# Patient Record
Sex: Female | Born: 1965 | Race: Black or African American | Hispanic: No | Marital: Single | Smoking: Never smoker
Health system: Southern US, Community
[De-identification: ages and names within clinical notes are randomized; demographics above are authoritative.]

## PROBLEM LIST (undated history)

## (undated) DIAGNOSIS — M1990 Unspecified osteoarthritis, unspecified site: Secondary | ICD-10-CM

## (undated) DIAGNOSIS — I341 Nonrheumatic mitral (valve) prolapse: Secondary | ICD-10-CM

## (undated) DIAGNOSIS — M797 Fibromyalgia: Secondary | ICD-10-CM

## (undated) DIAGNOSIS — M3500 Sicca syndrome, unspecified: Secondary | ICD-10-CM

## (undated) HISTORY — DX: Sjogren syndrome, unspecified: M35.00

---

## 2013-11-26 DIAGNOSIS — M069 Rheumatoid arthritis, unspecified: Secondary | ICD-10-CM | POA: Insufficient documentation

## 2013-11-26 DIAGNOSIS — I341 Nonrheumatic mitral (valve) prolapse: Secondary | ICD-10-CM | POA: Insufficient documentation

## 2013-11-26 DIAGNOSIS — K219 Gastro-esophageal reflux disease without esophagitis: Secondary | ICD-10-CM | POA: Insufficient documentation

## 2013-11-26 DIAGNOSIS — M3501 Sicca syndrome with keratoconjunctivitis: Secondary | ICD-10-CM | POA: Insufficient documentation

## 2013-12-26 DIAGNOSIS — R413 Other amnesia: Secondary | ICD-10-CM | POA: Insufficient documentation

## 2013-12-26 DIAGNOSIS — R002 Palpitations: Secondary | ICD-10-CM | POA: Insufficient documentation

## 2013-12-26 DIAGNOSIS — Z0000 Encounter for general adult medical examination without abnormal findings: Secondary | ICD-10-CM | POA: Insufficient documentation

## 2014-07-26 DIAGNOSIS — F419 Anxiety disorder, unspecified: Secondary | ICD-10-CM | POA: Insufficient documentation

## 2015-02-14 DIAGNOSIS — Z79899 Other long term (current) drug therapy: Secondary | ICD-10-CM | POA: Insufficient documentation

## 2015-10-07 DIAGNOSIS — M7062 Trochanteric bursitis, left hip: Secondary | ICD-10-CM | POA: Insufficient documentation

## 2016-04-17 DIAGNOSIS — A6000 Herpesviral infection of urogenital system, unspecified: Secondary | ICD-10-CM | POA: Insufficient documentation

## 2016-04-17 DIAGNOSIS — Z1151 Encounter for screening for human papillomavirus (HPV): Secondary | ICD-10-CM | POA: Insufficient documentation

## 2016-04-17 DIAGNOSIS — Z124 Encounter for screening for malignant neoplasm of cervix: Secondary | ICD-10-CM | POA: Insufficient documentation

## 2016-04-17 DIAGNOSIS — B009 Herpesviral infection, unspecified: Secondary | ICD-10-CM | POA: Insufficient documentation

## 2016-04-17 DIAGNOSIS — N870 Mild cervical dysplasia: Secondary | ICD-10-CM | POA: Insufficient documentation

## 2016-05-02 DIAGNOSIS — M0609 Rheumatoid arthritis without rheumatoid factor, multiple sites: Secondary | ICD-10-CM | POA: Insufficient documentation

## 2016-11-10 ENCOUNTER — Encounter (HOSPITAL_COMMUNITY): Payer: Self-pay | Admitting: *Deleted

## 2016-11-10 ENCOUNTER — Emergency Department (HOSPITAL_COMMUNITY)
Admission: EM | Admit: 2016-11-10 | Discharge: 2016-11-10 | Disposition: A | Discharge status: Home / Self Care | Payer: Federal, State, Local not specified - PPO | Attending: Emergency Medicine | Admitting: Emergency Medicine

## 2016-11-10 ENCOUNTER — Emergency Department (HOSPITAL_COMMUNITY): Payer: Federal, State, Local not specified - PPO

## 2016-11-10 DIAGNOSIS — R42 Dizziness and giddiness: Secondary | ICD-10-CM | POA: Diagnosis not present

## 2016-11-10 HISTORY — DX: Fibromyalgia: M79.7

## 2016-11-10 HISTORY — DX: Nonrheumatic mitral (valve) prolapse: I34.1

## 2016-11-10 HISTORY — DX: Unspecified osteoarthritis, unspecified site: M19.90

## 2016-11-10 LAB — BASIC METABOLIC PANEL
Anion gap: 8 (ref 5–15)
BUN: 9 mg/dL (ref 6–20)
CHLORIDE: 107 mmol/L (ref 101–111)
CO2: 23 mmol/L (ref 22–32)
CREATININE: 0.85 mg/dL (ref 0.44–1.00)
Calcium: 9.3 mg/dL (ref 8.9–10.3)
GFR calc non Af Amer: >60 mL/min (ref >60)
Glucose, Bld: 81 mg/dL (ref 65–99)
POTASSIUM: 4.1 mmol/L (ref 3.5–5.1)
SODIUM: 138 mmol/L (ref 135–145)

## 2016-11-10 LAB — CBC WITH DIFFERENTIAL/PLATELET
Basophils Absolute: 0 10*3/uL (ref 0.0–0.1)
Basophils Relative: 0 %
EOS ABS: 0 10*3/uL (ref 0.0–0.7)
Eosinophils Relative: 0 %
HCT: 35 % — ABNORMAL LOW (ref 36.0–46.0)
HEMOGLOBIN: 10.9 g/dL — AB (ref 12.0–15.0)
LYMPHS ABS: 2.3 10*3/uL (ref 0.7–4.0)
LYMPHS PCT: 35 %
MCH: 27.5 pg (ref 26.0–34.0)
MCHC: 31.1 g/dL (ref 30.0–36.0)
MCV: 88.4 fL (ref 78.0–100.0)
Monocytes Absolute: 0.9 10*3/uL (ref 0.1–1.0)
Monocytes Relative: 13 %
NEUTROS PCT: 52 %
Neutro Abs: 3.5 10*3/uL (ref 1.7–7.7)
Platelets: 303 10*3/uL (ref 150–400)
RBC: 3.96 MIL/uL (ref 3.87–5.11)
RDW: 13.9 % (ref 11.5–15.5)
WBC: 6.7 10*3/uL (ref 4.0–10.5)

## 2016-11-10 MED ORDER — MECLIZINE HCL 25 MG PO TABS: 25.0000 mg | ORAL_TABLET | Freq: Three times a day (TID) | ORAL | 0 refills | Status: DC | PRN

## 2016-11-10 MED ORDER — SODIUM CHLORIDE 0.9 % IV BOLUS (SEPSIS)
1000.0000 mL | Freq: Once | INTRAVENOUS | Status: AC
  Administered 2016-11-10: 1000 mL via INTRAVENOUS

## 2016-11-10 MED ORDER — MECLIZINE HCL 25 MG PO TABS
25.0000 mg | ORAL_TABLET | Freq: Once | ORAL | Status: AC
  Administered 2016-11-10: 25 mg via ORAL
  Filled 2016-11-10: qty 1

## 2016-11-10 NOTE — ED Notes (Addendum)
Wheeled pt in w/c to restroom. Visitor in restroom with pt.

## 2016-11-10 NOTE — ED Notes (Signed)
Pt back from restroom, placed back on monitor.

## 2016-11-10 NOTE — Discharge Instructions (Signed)
Vertigo as prescribed.  Drink plenty of fluids and get plenty of rest.  Follow-up with your primary Dr. if not improving in the next several days, and return to the emergency department if your symptoms significantly worsen or change.

## 2016-11-10 NOTE — ED Notes (Signed)
IV removed.

## 2016-11-10 NOTE — ED Notes (Signed)
ED Provider at bedside. 

## 2016-11-10 NOTE — ED Notes (Signed)
PT reports dizziness that feels as though she is spinning. PT reports it is worse with position changes. PT reports she is having to hold on to objects to walk.

## 2016-11-10 NOTE — ED Provider Notes (Signed)
[facility]-EMERGENCY DEPT Provider Note   CSN: [id number] Arrival date & time: 11/10/16  1324     History   Chief Complaint Chief Complaint  Patient presents with  . Dizziness    HPI Stephanie Kirk is a 51 y.o. female.  Patient is a 51 year old female with history of mitral valve prolapse, fibromyalgia, rheumatoid arthritis. She presents today for evaluation of headache and dizziness that started yesterday afternoon. She reports the headache is in the back of her head and that her dizziness feels like a spinning sensation. It is worse with changing position and turning her head. She denies any injury or trauma. She does report recent URI type symptoms, but denies any shortness of breath, fevers, or neck pain.   The history is provided by the patient.  Dizziness  Quality:  Head spinning Severity:  Moderate Onset quality:  Sudden Duration:  1 day Timing:  Intermittent Progression:  Worsening Chronicity:  New Relieved by:  Nothing Worsened by:  Nothing Ineffective treatments:  None tried   Past Medical History:  Diagnosis Date  . Arthritis    rheumatoid  . Fibromyalgia   . Mitral valve prolapse     There are no active problems to display for this patient.   History reviewed. No pertinent surgical history.  OB History    No data available       Home Medications    Prior to Admission medications   Not on File    Family History No family history on file.  Social History Social History  Substance Use Topics  . Smoking status: Never Smoker  . Smokeless tobacco: Never Used  . Alcohol use No     Allergies   Patient has no known allergies.   Review of Systems Review of Systems  Neurological: Positive for dizziness.  All other systems reviewed and are negative.    Physical Exam Updated Vital Signs BP 138/87 (BP Location: Left Arm)   Pulse 74   Temp 98.1 F (36.7 C) (Oral)   Resp 18   Ht 5\' 6"  (1.676 m)   Wt 155 lb (70.3 kg)   LMP 11/10/2013    SpO2 100%   BMI 25.02 kg/m   Physical Exam  Constitutional: She is oriented to person, place, and time. She appears well-developed and well-nourished. No distress.  HENT:  Head: Normocephalic and atraumatic.  Mouth/Throat: Oropharynx is clear and moist. No oropharyngeal exudate.  Eyes: EOM are normal. Pupils are equal, round, and reactive to light.  There is no nystagmus.  Neck: Normal range of motion. Neck supple.  Cardiovascular: Normal rate and regular rhythm.  Exam reveals no gallop and no friction rub.   No murmur heard. Pulmonary/Chest: Effort normal and breath sounds normal. No respiratory distress. She has no wheezes.  Abdominal: Soft. Bowel sounds are normal. She exhibits no distension. There is no tenderness.  Musculoskeletal: Normal range of motion.  Lymphadenopathy:    She has no cervical adenopathy.  Neurological: She is alert and oriented to person, place, and time. No cranial nerve deficit. She exhibits normal muscle tone. Coordination normal.  Skin: Skin is warm and dry. She is not diaphoretic.  Nursing note and vitals reviewed.    ED Treatments / Results  Labs (all labs ordered are listed, but only abnormal results are displayed) Labs Reviewed  BASIC METABOLIC PANEL  CBC WITH DIFFERENTIAL/PLATELET    EKG  EKG Interpretation None       Radiology No results found.  Procedures Procedures (including critical care  time)  Medications Ordered in ED Medications  sodium chloride 0.9 % bolus 1,000 mL (not administered)  meclizine (ANTIVERT) tablet 25 mg (not administered)     Initial Impression / Assessment and Plan / ED Course  I have reviewed the triage vital signs and the nursing notes.  Pertinent labs & imaging results that were available during my care of the patient were reviewed by me and considered in my medical decision making (see chart for details).  This patient presents with complaints of dizziness and headache. Her physical examination is  unremarkable and laboratory studies and head CT are negative. I highly doubt an acute CVA or other emergent pathology. This may well be vertigo. She was given meclizine and appears to be feeling better. She will be discharged with meclizine and when necessary return.  Final Clinical Impressions(s) / ED Diagnoses   Final diagnoses:  None    New Prescriptions New Prescriptions   No medications on file     Geoffery Lyonsouglas Hamed Debella, MD 11/10/16 1055

## 2016-11-10 NOTE — ED Triage Notes (Signed)
Pt states occipital headache and dizziness since yesterday.  States "dizzy spells" in the past, but this feels different in that she is very off balance and keeps tripping.

## 2016-11-10 NOTE — ED Notes (Signed)
Patient transported to CT 

## 2016-12-30 ENCOUNTER — Emergency Department (HOSPITAL_COMMUNITY)
Admission: EM | Admit: 2016-12-30 | Discharge: 2016-12-30 | Disposition: A | Discharge status: Home / Self Care | Payer: Federal, State, Local not specified - PPO | Attending: Emergency Medicine | Admitting: Emergency Medicine

## 2016-12-30 ENCOUNTER — Encounter (HOSPITAL_COMMUNITY): Payer: Self-pay | Admitting: Emergency Medicine

## 2016-12-30 DIAGNOSIS — Z5321 Procedure and treatment not carried out due to patient leaving prior to being seen by health care provider: Secondary | ICD-10-CM | POA: Insufficient documentation

## 2016-12-30 DIAGNOSIS — M79602 Pain in left arm: Secondary | ICD-10-CM | POA: Diagnosis not present

## 2016-12-30 LAB — I-STAT TROPONIN, ED: Troponin i, poc: 0 ng/mL (ref 0.00–0.08)

## 2016-12-30 NOTE — ED Triage Notes (Signed)
Pt presents with pain in left arm that started at appx 2300 on 12/29/16 that is worse with movement. Pt denies any prior injury to arm. Pt states she was seen by PCP for pain in back yesterday and they thought it was flu related pain.

## 2016-12-30 NOTE — ED Notes (Signed)
Pt sts she will call her PCP in the morning and follow up with them, pt left

## 2017-04-20 ENCOUNTER — Encounter: Payer: Self-pay | Admitting: Neurology

## 2017-04-20 ENCOUNTER — Ambulatory Visit (INDEPENDENT_AMBULATORY_CARE_PROVIDER_SITE_OTHER): Payer: Federal, State, Local not specified - PPO | Admitting: Neurology

## 2017-04-20 ENCOUNTER — Encounter (INDEPENDENT_AMBULATORY_CARE_PROVIDER_SITE_OTHER): Payer: Self-pay

## 2017-04-20 VITALS — BP 123/79 | HR 86 | Ht 66.0 in | Wt 152.6 lb

## 2017-04-20 DIAGNOSIS — G43709 Chronic migraine without aura, not intractable, without status migrainosus: Secondary | ICD-10-CM

## 2017-04-20 DIAGNOSIS — R413 Other amnesia: Secondary | ICD-10-CM | POA: Diagnosis not present

## 2017-04-20 DIAGNOSIS — IMO0002 Reserved for concepts with insufficient information to code with codable children: Secondary | ICD-10-CM | POA: Insufficient documentation

## 2017-04-20 MED ORDER — NORTRIPTYLINE HCL 25 MG PO CAPS: 50.0000 mg | ORAL_CAPSULE | Freq: Every day | ORAL | 11 refills | Status: DC

## 2017-04-20 NOTE — Progress Notes (Signed)
PATIENT: Stephanie Kirk DOB: 1966/05/02  Chief Complaint  Patient presents with  . NP  [place] Le  . Optic Migraines    Seeing flashes of light/ tingling in arms. blurry vison. the last 2 months increased sx.      HISTORICAL  Stephanie Kirk is a 51 year old right-handed female, seen in refer by her optometrist Dr. Conley Rolls, [place] for evaluation of same seeing flashing light, migraine, her primary care is Dr. Lawerance Bach, [place]. initial evaluation was on April 20 2017.  Have reviewed and summarized referring note, she had a history of rheumatoid arthritis, is on tapering dose of prednisone, now taking prednisone 2.5 milligrams daily, methotrexate 25 mg once a week, folic acid, also carried a diagnosis of Sjogren's, mitral valve prolapse, she has been on disability due to her rheumatoid arthritis  She reported a history of migraine headache when she was young, she was seen by neurologist about 10 years ago, was given prescription medication, but she never tried it, worry about the side effects,  Around beginning of 2018, she noted recurrent episode of flashing light in her visual field, lasting for a few minutes, oftentimes is not followed by headaches, she had a similar episode in the past, but much less frequent, she is a poor historian, it is hard for her to characterize her symptoms,  She also complains of gradual onset memory trouble, complains of excessive stress, noted to have mild word finding difficulties.  Personally reviewed CT head without contrast in February 2018 that was normal.  Laboratory evaluation in 2018 showed negative troponin, mild anemia hemoglobin of 10 point 9, normal CMP, iron panel showed ferritin 94, normal TSH 1.9  MRI of the brain in 2016 from [facility] that was normal   REVIEW OF SYSTEMS: Full 14 system review of systems performed and notable only for fever, chill, weight gain, weight loss, fatigue, palpitation, blurry vision, loss of vision, eye pain, cough,  constipation, easy bruising, joints pain, swelling, memory loss, confusion, headache, dizziness, not enough sleep, change in appetite, disinteresting activities  ALLERGIES: No Known Allergies  HOME MEDICATIONS: Current Outpatient Prescriptions  Medication Sig Dispense Refill  . meclizine (ANTIVERT) 25 MG tablet Take 1 tablet (25 mg total) by mouth 3 (three) times daily as needed for dizziness. 12 tablet 0   No current facility-administered medications for this visit.     PAST MEDICAL HISTORY: Past Medical History:  Diagnosis Date  . Arthritis    rheumatoid  . Fibromyalgia   . Mitral valve prolapse   . Sjogren's disease (HCC)     PAST SURGICAL HISTORY: No past surgical history on file.  FAMILY HISTORY: No family history on file.  SOCIAL HISTORY:  Social History   Social History  . Marital status: Single    Spouse name: N/A  . Number of children: N/A  . Years of education: N/A   Occupational History  . Not on file.   Social History Main Topics  . Smoking status: Never Smoker  . Smokeless tobacco: Never Used  . Alcohol use No  . Drug use: No  . Sexual activity: Not on file   Other Topics Concern  . Not on file   Social History Narrative   Lives at home with son and 2 grand daughters.  Retired.  HS grad.  3 Children.     PHYSICAL EXAM   Vitals:   04/20/17 0949  BP: 123/79  Pulse: 86  Weight: 152 lb 9.6 oz (69.2 kg)  Height:  5\' 6"  (1.676 m)    Not recorded      Body mass index is 24.63 kg/m.  PHYSICAL EXAMNIATION:  Gen: NAD, conversant, well nourised, obese, well groomed                     Cardiovascular: Regular rate rhythm, no peripheral edema, warm, nontender. Eyes: Conjunctivae clear without exudates or hemorrhage Neck: Supple, no carotid bruits. Pulmonary: Clear to auscultation bilaterally   NEUROLOGICAL EXAM:  MENTAL STATUS: Speech:    Speech is normal; fluent and spontaneous with normal comprehension.  Cognition:      Orientation to time, place and person     Normal recent and remote memory     Normal Attention span and concentration     Normal Language, naming, repeating,spontaneous speech     Fund of knowledge   CRANIAL NERVES: CN II: Visual fields are full to confrontation. Fundoscopic exam is normal with sharp discs and no vascular changes. Pupils are round equal and briskly reactive to light. CN III, IV, VI: extraocular movement are normal. No ptosis. CN V: Facial sensation is intact to pinprick in all 3 divisions bilaterally. Corneal responses are intact.  CN VII: Face is symmetric with normal eye closure and smile. CN VIII: Hearing is normal to rubbing fingers CN IX, X: Palate elevates symmetrically. Phonation is normal. CN XI: Head turning and shoulder shrug are intact CN XII: Tongue is midline with normal movements and no atrophy.  MOTOR: There is no pronator drift of out-stretched arms. Muscle bulk and tone are normal. Muscle strength is normal.  REFLEXES: Reflexes are 2+ and symmetric at the biceps, triceps, knees, and ankles. Plantar responses are flexor.  SENSORY: Intact to light touch, pinprick, positional sensation and vibratory sensation are intact in fingers and toes.  COORDINATION: Rapid alternating movements and fine finger movements are intact. There is no dysmetria on finger-to-nose and heel-knee-shin.    GAIT/STANCE: Posture is normal. Gait is steady with normal steps, base, arm swing, and turning. Heel and toe walking are normal. Tandem gait is normal.  Romberg is absent.   DIAGNOSTIC DATA (LABS, IMAGING, TESTING) - I reviewed patient records, labs, notes, testing and imaging myself where available.   ASSESSMENT AND PLAN  Stephanie Kirk is a 51 y.o. female   Chronic migraine Mild cognitive impairment Chronic insomnia  MRI of the brain showed no significant abnormality in 2016, CT head without contrast was normal in April 2018  Laboratory evaluation to rule out  treatable etiology  Nortriptyline 25 mg, titrating to 50 mg every night     Levert FeinsteinYijun Bilan Tedesco, M.D. Ph.D.  [facility] [address], [address] [place], [place] [address] Ph: [phone] Fax: [phone]  CC: Conley RollsLe, My [place], [place] Roberts GaudyBurns, Kevin L., MD

## 2017-04-21 LAB — C-REACTIVE PROTEIN: CRP: 2.5 mg/L (ref 0.0–4.9)

## 2017-04-21 LAB — HIV ANTIBODY (ROUTINE TESTING W REFLEX): HIV Screen 4th Generation wRfx: NONREACTIVE

## 2017-04-21 LAB — VITAMIN B12: Vitamin B-12: 606 pg/mL (ref 232–1245)

## 2017-04-21 LAB — VITAMIN D 25 HYDROXY (VIT D DEFICIENCY, FRACTURES): VIT D 25 HYDROXY: 24.2 ng/mL — AB (ref 30.0–100.0)

## 2017-04-21 LAB — RPR: RPR: NONREACTIVE

## 2017-04-21 LAB — SEDIMENTATION RATE: SED RATE: 21 mm/h (ref 0–40)

## 2017-04-22 ENCOUNTER — Telehealth: Payer: Self-pay | Admitting: *Deleted

## 2017-04-22 NOTE — Telephone Encounter (Signed)
-----   Message from Levert FeinsteinYijun Yan, MD sent at 04/22/2017  7:53 AM EDT ----- Please call patient: Laboratory evaluation showed mildly decreased vitamin D 24, otherwise was normal.

## 2017-04-22 NOTE — Telephone Encounter (Signed)
LMVM for pt to return call for lab results.  

## 2017-04-22 NOTE — Progress Notes (Signed)
She should take over-the-counter vitamin D3 supplement 1000 units daily.

## 2017-04-22 NOTE — Telephone Encounter (Signed)
Spoke to pt and relayed the results of her labs, Vit D mildly decreased.  Recommended Vit D3 supplement 1000u daily.  She will take that until seen here again in f/u.  Otherwise labs normal.  Pt verbalized understanding.

## 2017-04-29 ENCOUNTER — Other Ambulatory Visit: Payer: Federal, State, Local not specified - PPO

## 2017-05-19 ENCOUNTER — Other Ambulatory Visit: Payer: Federal, State, Local not specified - PPO

## 2017-07-19 ENCOUNTER — Encounter (HOSPITAL_COMMUNITY): Payer: Self-pay | Admitting: Emergency Medicine

## 2017-07-19 ENCOUNTER — Other Ambulatory Visit: Payer: Self-pay

## 2017-07-19 ENCOUNTER — Emergency Department (HOSPITAL_COMMUNITY): Payer: Federal, State, Local not specified - PPO

## 2017-07-19 ENCOUNTER — Emergency Department (HOSPITAL_COMMUNITY)
Admission: EM | Admit: 2017-07-19 | Discharge: 2017-07-19 | Disposition: A | Discharge status: Home / Self Care | Payer: Federal, State, Local not specified - PPO | Attending: Emergency Medicine | Admitting: Emergency Medicine

## 2017-07-19 DIAGNOSIS — M5432 Sciatica, left side: Secondary | ICD-10-CM

## 2017-07-19 DIAGNOSIS — Z79899 Other long term (current) drug therapy: Secondary | ICD-10-CM | POA: Insufficient documentation

## 2017-07-19 DIAGNOSIS — M5442 Lumbago with sciatica, left side: Secondary | ICD-10-CM | POA: Insufficient documentation

## 2017-07-19 DIAGNOSIS — M545 Low back pain: Secondary | ICD-10-CM | POA: Diagnosis present

## 2017-07-19 MED ORDER — IBUPROFEN 600 MG PO TABS: 600.0000 mg | ORAL_TABLET | Freq: Three times a day (TID) | ORAL | 0 refills | Status: AC | PRN

## 2017-07-19 MED ORDER — DEXAMETHASONE SODIUM PHOSPHATE 10 MG/ML IJ SOLN
10.0000 mg | Freq: Once | INTRAMUSCULAR | Status: AC
  Administered 2017-07-19: 10 mg via INTRAMUSCULAR
  Filled 2017-07-19: qty 1

## 2017-07-19 MED ORDER — OXYCODONE-ACETAMINOPHEN 5-325 MG PO TABS
2.0000 | ORAL_TABLET | Freq: Once | ORAL | Status: AC
  Administered 2017-07-19: 2 via ORAL
  Filled 2017-07-19: qty 2

## 2017-07-19 MED ORDER — KETOROLAC TROMETHAMINE 60 MG/2ML IM SOLN
60.0000 mg | Freq: Once | INTRAMUSCULAR | Status: AC
  Administered 2017-07-19: 60 mg via INTRAMUSCULAR
  Filled 2017-07-19: qty 2

## 2017-07-19 MED ORDER — HYDROCODONE-ACETAMINOPHEN 5-325 MG PO TABS: 1.0000 | ORAL_TABLET | ORAL | 0 refills | Status: DC | PRN

## 2017-07-19 NOTE — ED Provider Notes (Signed)
[facility] EMERGENCY DEPARTMENT Provider Note   CSN: [id number] Arrival date & time: 07/19/17  1357     History   Chief Complaint Chief Complaint  Patient presents with  . Back Pain    HPI Stephanie Kirk is a 51 y.o. female.  HPI  51 year old female with past medical history of rheumatoid arthritis and Sjogren's disease here with acute on chronic back pain.  The patient states that intermittently over the last several months, she has had recurrent episodes in which her left lower back feels tight and spasm like.  The pain shoots down her left leg with associated hot and cold feeling.  This pain seems to worsen with turning and position changes.  She feels like it is an aching, throbbing, cramp-like pain.  This pain has acutely worsened over the last several hours.  She states began after she twisted to turn to her left than right.  She denies any associated numbness or weakness in her lower extremities.  No loss of bowel or bladder function.  No abdominal pain.  No fevers, chills, weight loss, or night sweats.  Past Medical History:  Diagnosis Date  . Arthritis    rheumatoid  . Fibromyalgia   . Mitral valve prolapse   . Sjogren's disease [facility])     Patient Active Problem List   Diagnosis Date Noted  . Chronic migraine 04/20/2017  . Memory loss 04/20/2017    No past surgical history on file.  OB History    No data available       Home Medications    Prior to Admission medications   Medication Sig Start Date End Date Taking? Authorizing Provider  Calcium Carbonate-Vitamin D3 (CALCIUM 600-D) 600-400 MG-UNIT TABS Take by mouth. Once daily    [provider]  carboxymethylcellulose (REFRESH PLUS) 0.5 % SOLN 1 drop daily as needed.    [provider]  cycloSPORINE (RESTASIS) 0.05 % ophthalmic emulsion Place 1 drop into both eyes 2 (two) times daily.    [provider]  folic acid (FOLVITE) 1 MG tablet Take 1 mg by mouth daily.     [provider]  HYDROcodone-acetaminophen (NORCO/VICODIN) 5-325 MG tablet Take 1-2 tablets every 4 (four) hours as needed by mouth for severe pain. 07/19/17   Shaune Pollack, MD  hydroxychloroquine (PLAQUENIL) 200 MG tablet Take 200 mg by mouth 2 (two) times daily.    [provider]  ibuprofen (ADVIL,MOTRIN) 600 MG tablet Take 1 tablet (600 mg total) every 8 (eight) hours as needed by mouth for moderate pain. 07/19/17   Shaune Pollack, MD  iron polysaccharides (NIFEREX) 150 MG capsule Take 150 mg by mouth daily.    [provider]  magnesium oxide (MAG-OX) 400 MG tablet Take 400 mg by mouth daily.    [provider]  meclizine (ANTIVERT) 25 MG tablet Take 1 tablet (25 mg total) by mouth 3 (three) times daily as needed for dizziness. 11/10/16   Geoffery Lyons, MD  methotrexate (RHEUMATREX) 2.5 MG tablet Take 2.5 mg by mouth once a week. Caution:Chemotherapy. Protect from light.    [provider]  nortriptyline (PAMELOR) 25 MG capsule Take 2 capsules (50 mg total) by mouth at bedtime. 04/20/17   Levert Feinstein, MD  omeprazole (PRILOSEC) 40 MG capsule Take 40 mg by mouth daily.    [provider]  predniSONE (DELTASONE) 10 MG tablet Take 10 mg by mouth daily with breakfast.    [provider]  sulfaSALAzine (AZULFIDINE) 500 MG  tablet Take 500 mg by mouth 2 (two) times daily.    [provider]  valACYclovir (VALTREX) 500 MG tablet Take 500 mg by mouth daily.    [provider]  verapamil (CALAN-SR) 120 MG CR tablet Take 120 mg by mouth 2 (two) times daily.    [provider]    Family History Family History  Problem Relation Age of Onset  . Asthma Mother   . Hypertension Mother   . Hypertension Father   . Stroke Father   . Heart attack Father   . Thyroid disease Sister   . Diabetes Maternal Grandmother   . Cancer Paternal Grandmother     Social History Social History   Tobacco Use  . Smoking status:  Never Smoker  . Smokeless tobacco: Never Used  Substance Use Topics  . Alcohol use: No    Comment: quit 06/2010  . Drug use: No     Allergies   Patient has no known allergies.   Review of Systems Review of Systems  Musculoskeletal: Positive for arthralgias, back pain and gait problem (2/2 pain).  All other systems reviewed and are negative.    Physical Exam Updated Vital Signs BP 124/82 (BP Location: Right Arm)   Pulse 62   Temp 98.3 F (36.8 C) (Oral)   Resp 15   Ht 5\' 6"  (1.676 m)   Wt 72.6 kg (160 lb)   LMP 11/10/2013   SpO2 96%   BMI 25.82 kg/m   Physical Exam  Constitutional: She is oriented to person, place, and time. She appears well-developed and well-nourished. No distress.  HENT:  Head: Normocephalic and atraumatic.  Eyes: Conjunctivae are normal.  Neck: Neck supple.  Cardiovascular: Normal rate, regular rhythm and normal heart sounds. Exam reveals no friction rub.  No murmur heard. Pulmonary/Chest: Effort normal and breath sounds normal. No respiratory distress. She has no wheezes. She has no rales.  Abdominal: She exhibits no distension.  Musculoskeletal: She exhibits no edema.  Neurological: She is alert and oriented to person, place, and time. She exhibits normal muscle tone.  Skin: Skin is warm. Capillary refill takes less than 2 seconds.  Psychiatric: She has a normal mood and affect.  Nursing note and vitals reviewed.   Spine Exam: Inspection/Palpation: Moderate TTP over left paraspinal musculature with + straight leg raise. No midline TTP or deformity. No redness or warmth. Strength: 5/5 throughout LE bilaterally (hip flexion/extension, adduction/abduction; knee flexion/extension; foot dorsiflexion/plantarflexion, inversion/eversion; great toe inversion) Sensation: Intact to light touch in proximal and distal LE bilaterally Reflexes: 2+ quadriceps and achilles reflexes  ED Treatments / Results  Labs (all labs ordered are listed, but only  abnormal results are displayed) Labs Reviewed - No data to display  EKG  EKG Interpretation None       Radiology Dg Lumbar Spine Complete  Result Date: 07/19/2017 CLINICAL DATA:  Pt has hx of throbbing back pain, normally she stretches and it goes away. Pt pointing to lumbar spine. Pt has been dealing with this off and on for 1 year. Pt has limited ROM, states she can walk with assistance and had to be helped in and out of car. Denies injury to lumbar spine. EXAM: LUMBAR SPINE - COMPLETE 4+ VIEW COMPARISON:  None. FINDINGS: No fracture, bone lesion or spondylolisthesis. Mild loss disc height at L3-L4 with minor endplate osteophytes. No other degenerative change. Soft tissues are unremarkable. IMPRESSION: 1. No fracture or acute finding. 2. Mild disc degenerative change at L3-L4. Electronically Signed  By: Amie Portland M.D.   On: 07/19/2017 17:32    Procedures Procedures (including critical care time)  Medications Ordered in ED Medications  ketorolac (TORADOL) injection 60 mg (60 mg Intramuscular Given 07/19/17 1631)  oxyCODONE-acetaminophen (PERCOCET/ROXICET) 5-325 MG per tablet 2 tablet (2 tablets Oral Given 07/19/17 1631)  dexamethasone (DECADRON) injection 10 mg (10 mg Intramuscular Given 07/19/17 1737)     Initial Impression / Assessment and Plan / ED Course  I have reviewed the triage vital signs and the nursing notes.  Pertinent labs & imaging results that were available during my care of the patient were reviewed by me and considered in my medical decision making (see chart for details).     51 yo F with h/o RA, chronic back pain here with acute on chronic left paraspinal lower back pain, radiating to her leg. Plain films neg. No fevers or signs to suggest infectious etiology such as osteo or epidural abscess. She has no signs of cauda equina. Exam, history is c/w sciatica. She feels markedly improved with symptom treatmetn in ED. No abdominal pain, dysuria, or sx to suggest  referred pain from GU or intra-abdominal pathology. Will d/c with supportive care, outpt follow-up.  Final Clinical Impressions(s) / ED Diagnoses   Final diagnoses:  Sciatica of left side    New Prescriptions This SmartLink is deprecated. Use AVSMEDLIST instead to display the medication list for a patient.   Shaune Pollack, MD 07/20/17 [phone]

## 2017-07-19 NOTE — ED Notes (Signed)
Patient transported to X-ray 

## 2017-07-19 NOTE — ED Triage Notes (Addendum)
Pt has hx of back pain, normally she stretches and it goes away. Pt pointing to lumbar spine. Pt has been dealing with this off and on for 1 year. Denies loss of bladder or bowel. Pt has limited ROM, states she can walk with assistance and had to be helped in and out of car.

## 2017-07-21 ENCOUNTER — Ambulatory Visit: Payer: Federal, State, Local not specified - PPO | Admitting: Neurology

## 2017-08-17 ENCOUNTER — Other Ambulatory Visit: Payer: Federal, State, Local not specified - PPO

## 2017-12-05 ENCOUNTER — Emergency Department (HOSPITAL_COMMUNITY)
Admission: EM | Admit: 2017-12-05 | Discharge: 2017-12-05 | Disposition: A | Discharge status: Home / Self Care | Payer: Federal, State, Local not specified - PPO | Attending: Emergency Medicine | Admitting: Emergency Medicine

## 2017-12-05 ENCOUNTER — Emergency Department (HOSPITAL_COMMUNITY): Payer: Federal, State, Local not specified - PPO

## 2017-12-05 ENCOUNTER — Encounter (HOSPITAL_COMMUNITY): Payer: Self-pay | Admitting: Emergency Medicine

## 2017-12-05 ENCOUNTER — Other Ambulatory Visit: Payer: Self-pay

## 2017-12-05 DIAGNOSIS — R252 Cramp and spasm: Secondary | ICD-10-CM | POA: Insufficient documentation

## 2017-12-05 DIAGNOSIS — R079 Chest pain, unspecified: Secondary | ICD-10-CM | POA: Diagnosis not present

## 2017-12-05 DIAGNOSIS — R42 Dizziness and giddiness: Secondary | ICD-10-CM | POA: Diagnosis not present

## 2017-12-05 DIAGNOSIS — Z79899 Other long term (current) drug therapy: Secondary | ICD-10-CM | POA: Diagnosis not present

## 2017-12-05 DIAGNOSIS — M5412 Radiculopathy, cervical region: Secondary | ICD-10-CM | POA: Insufficient documentation

## 2017-12-05 DIAGNOSIS — R202 Paresthesia of skin: Secondary | ICD-10-CM | POA: Diagnosis present

## 2017-12-05 LAB — CBC
HCT: 33.2 % — ABNORMAL LOW (ref 36.0–46.0)
HEMOGLOBIN: 10.3 g/dL — AB (ref 12.0–15.0)
MCH: 27.6 pg (ref 26.0–34.0)
MCHC: 31 g/dL (ref 30.0–36.0)
MCV: 89 fL (ref 78.0–100.0)
Platelets: 360 10*3/uL (ref 150–400)
RBC: 3.73 MIL/uL — ABNORMAL LOW (ref 3.87–5.11)
RDW: 16.2 % — AB (ref 11.5–15.5)
WBC: 10.1 10*3/uL (ref 4.0–10.5)

## 2017-12-05 LAB — BASIC METABOLIC PANEL
ANION GAP: 11 (ref 5–15)
BUN: 13 mg/dL (ref 6–20)
CALCIUM: 9.1 mg/dL (ref 8.9–10.3)
CO2: 25 mmol/L (ref 22–32)
CREATININE: 0.85 mg/dL (ref 0.44–1.00)
Chloride: 101 mmol/L (ref 101–111)
GFR calc Af Amer: >60 mL/min (ref >60)
GFR calc non Af Amer: >60 mL/min (ref >60)
GLUCOSE: 81 mg/dL (ref 65–99)
Potassium: 4.1 mmol/L (ref 3.5–5.1)
Sodium: 137 mmol/L (ref 135–145)

## 2017-12-05 LAB — URINALYSIS, ROUTINE W REFLEX MICROSCOPIC
BILIRUBIN URINE: NEGATIVE
Bacteria, UA: NONE SEEN
Glucose, UA: NEGATIVE mg/dL
HGB URINE DIPSTICK: NEGATIVE
KETONES UR: NEGATIVE mg/dL
Leukocytes, UA: NEGATIVE
Nitrite: NEGATIVE
PH: 5 (ref 5.0–8.0)
Protein, ur: NEGATIVE mg/dL
Specific Gravity, Urine: 1.027 (ref 1.005–1.030)

## 2017-12-05 LAB — I-STAT TROPONIN, ED: Troponin i, poc: 0 ng/mL (ref 0.00–0.08)

## 2017-12-05 LAB — I-STAT BETA HCG BLOOD, ED (MC, WL, AP ONLY): I-stat hCG, quantitative: <5 m[IU]/mL (ref <5)

## 2017-12-05 MED ORDER — PREDNISONE 10 MG (21) PO TBPK: 10.0000 mg | ORAL_TABLET | Freq: Every day | ORAL | 0 refills | Status: DC

## 2017-12-05 MED ORDER — DEXAMETHASONE SODIUM PHOSPHATE 10 MG/ML IJ SOLN
10.0000 mg | Freq: Once | INTRAMUSCULAR | Status: AC
  Administered 2017-12-05: 10 mg via INTRAVENOUS
  Filled 2017-12-05: qty 1

## 2017-12-05 MED ORDER — HYDROCODONE-ACETAMINOPHEN 5-325 MG PO TABS: 1.0000 | ORAL_TABLET | ORAL | 0 refills | Status: DC | PRN

## 2017-12-05 NOTE — Discharge Instructions (Signed)
Go back to taking usual prednisone dose after you are done with the prednisone taper.

## 2017-12-05 NOTE — ED Notes (Signed)
Pt oob to bathroom with steady gait. 

## 2017-12-05 NOTE — ED Provider Notes (Signed)
[facility] EMERGENCY DEPARTMENT Provider Note   CSN: [id number] Arrival date & time: 12/05/17  1345     History   Chief Complaint Chief Complaint  Patient presents with  . Weakness  . Tingling  . Chest Pain    HPI Stephanie Kirk is a 52 y.o. female.  Pt presents to the ED today with several complaints.  Pt said she has tingling on the left side of her face intermittently for a few months.  She also has some numbness and pain to underside of left arm.  She also has some pain to her chest intermittently since 3/18.  The pt has been having cramping in her legs, saw neurologist, had negative bilateral LE [place] neg for DVT.  The pt is scheduled for EMG in April.  The pt also c/o dizziness.     Past Medical History:  Diagnosis Date  . Arthritis    rheumatoid  . Fibromyalgia   . Mitral valve prolapse   . Sjogren's disease [facility])     Patient Active Problem List   Diagnosis Date Noted  . Chronic migraine 04/20/2017  . Memory loss 04/20/2017    History reviewed. No pertinent surgical history.   OB History   None      Home Medications    Prior to Admission medications   Medication Sig Start Date End Date Taking? Authorizing Provider  Calcium Carbonate-Vitamin D3 (CALCIUM 600-D) 600-400 MG-UNIT TABS Take by mouth. Once daily    [provider]  carboxymethylcellulose (REFRESH PLUS) 0.5 % SOLN 1 drop daily as needed.    [provider]  cycloSPORINE (RESTASIS) 0.05 % ophthalmic emulsion Place 1 drop into both eyes 2 (two) times daily.    [provider]  folic acid (FOLVITE) 1 MG tablet Take 1 mg by mouth daily.    [provider]  HYDROcodone-acetaminophen (NORCO/VICODIN) 5-325 MG tablet Take 1-2 tablets by mouth every 4 (four) hours as needed for severe pain. 12/05/17   Jacalyn Lefevre, MD  hydroxychloroquine (PLAQUENIL) 200 MG tablet Take 200 mg by mouth 2 (two) times daily.    [provider]  ibuprofen  (ADVIL,MOTRIN) 600 MG tablet Take 1 tablet (600 mg total) every 8 (eight) hours as needed by mouth for moderate pain. 07/19/17   Shaune Pollack, MD  iron polysaccharides (NIFEREX) 150 MG capsule Take 150 mg by mouth daily.    [provider]  magnesium oxide (MAG-OX) 400 MG tablet Take 400 mg by mouth daily.    [provider]  meclizine (ANTIVERT) 25 MG tablet Take 1 tablet (25 mg total) by mouth 3 (three) times daily as needed for dizziness. 11/10/16   Geoffery Lyons, MD  methotrexate (RHEUMATREX) 2.5 MG tablet Take 2.5 mg by mouth once a week. Caution:Chemotherapy. Protect from light.    [provider]  nortriptyline (PAMELOR) 25 MG capsule Take 2 capsules (50 mg total) by mouth at bedtime. 04/20/17   Levert Feinstein, MD  omeprazole (PRILOSEC) 40 MG capsule Take 40 mg by mouth daily.    [provider]  predniSONE (STERAPRED UNI-PAK 21 TAB) 10 MG (21) TBPK tablet Take 1 tablet (10 mg total) by mouth daily. Take 6 tabs by mouth daily  for 2 days, then 5 tabs for 2 days, then 4 tabs for 2 days, then 3 tabs for 2 days, 2 tabs for 2 days, then 1 tab by mouth daily for 2 days 12/05/17   Jacalyn Lefevre, MD  sulfaSALAzine (AZULFIDINE) 500 MG tablet  Take 500 mg by mouth 2 (two) times daily.    [provider]  valACYclovir (VALTREX) 500 MG tablet Take 500 mg by mouth daily.    [provider]  verapamil (CALAN-SR) 120 MG CR tablet Take 120 mg by mouth 2 (two) times daily.    [provider]    Family History Family History  Problem Relation Age of Onset  . Asthma Mother   . Hypertension Mother   . Hypertension Father   . Stroke Father   . Heart attack Father   . Thyroid disease Sister   . Diabetes Maternal Grandmother   . Cancer Paternal Grandmother     Social History Social History   Tobacco Use  . Smoking status: Never Smoker  . Smokeless tobacco: Never Used  Substance Use Topics  . Alcohol use: No    Comment: quit 06/2010  . Drug  use: No     Allergies   Patient has no known allergies.   Review of Systems Review of Systems  Cardiovascular: Positive for chest pain.  Musculoskeletal:       Left arm pain  Neurological: Positive for dizziness, numbness and headaches.  All other systems reviewed and are negative.    Physical Exam Updated Vital Signs BP 117/69 (BP Location: Right Arm)   Pulse 89   Temp 98.1 F (36.7 C) (Oral)   Resp 17   Ht 5\' 6"  (1.676 m)   Wt 77.1 kg (170 lb)   LMP 11/10/2013   SpO2 98%   BMI 27.44 kg/m   Physical Exam  Constitutional: She is oriented to person, place, and time. She appears well-developed and well-nourished.  HENT:  Head: Normocephalic and atraumatic.  Eyes: Pupils are equal, round, and reactive to light. EOM are normal.  Neck: Normal range of motion. Neck supple.  Cardiovascular: Normal rate, regular rhythm, intact distal pulses and normal pulses.  Pulmonary/Chest: Effort normal and breath sounds normal.  Abdominal: Soft. Bowel sounds are normal.  Musculoskeletal: Normal range of motion.       Right lower leg: Normal.       Left lower leg: Normal.  Neurological: She is alert and oriented to person, place, and time.  Skin: Skin is warm and dry. Capillary refill takes less than 2 seconds.  Psychiatric: She has a normal mood and affect. Her behavior is normal.  Nursing note and vitals reviewed.    ED Treatments / Results  Labs (all labs ordered are listed, but only abnormal results are displayed) Labs Reviewed  CBC - Abnormal; Notable for the following components:      Result Value   RBC 3.73 (*)    Hemoglobin 10.3 (*)    HCT 33.2 (*)    RDW 16.2 (*)    All other components within normal limits  URINALYSIS, ROUTINE W REFLEX MICROSCOPIC - Abnormal; Notable for the following components:   Squamous Epithelial / LPF 0-5 (*)    All other components within normal limits  BASIC METABOLIC PANEL  CBG MONITORING, ED  I-STAT BETA HCG BLOOD, ED ([facility], [facility], [facility]  ONLY)  I-STAT TROPONIN, ED    EKG EKG Interpretation  Date/Time:  Saturday December 05 2017 13:52:45 EDT Ventricular Rate:  92 PR Interval:  134 QRS Duration: 72 QT Interval:  348 QTC Calculation: 430 R Axis:   67 Text Interpretation:  Normal sinus rhythm Normal ECG Confirmed by Jacalyn Lefevre [phone]) on 12/05/2017 4:01:48 PM   Radiology Dg Chest 2 View  Result Date: 12/05/2017  CLINICAL DATA:  Chest pressure and pain. EXAM: CHEST - 2 VIEW COMPARISON:  None. FINDINGS: The lungs are clear without focal pneumonia, edema, pneumothorax or pleural effusion. The cardiopericardial silhouette is within normal limits for size. The visualized bony structures of the thorax are intact. Telemetry leads overlie the chest. IMPRESSION: No active cardiopulmonary disease. Electronically Signed   By: Kennith Center M.D.   On: 12/05/2017 16:48   Ct Head Wo Contrast  Result Date: 12/05/2017 CLINICAL DATA:  Headache, acute severe headache worst of life, LEFT arm numbness and tingling since Thursday worse today EXAM: CT HEAD WITHOUT CONTRAST CT CERVICAL SPINE WITHOUT CONTRAST TECHNIQUE: Multidetector CT imaging of the head and cervical spine was performed following the standard protocol without intravenous contrast. Multiplanar CT image reconstructions of the cervical spine were also generated. COMPARISON:  CT head 11/10/2016 FINDINGS: CT HEAD FINDINGS Brain: Normal ventricular morphology. No midline shift or mass effect. Normal appearance of brain parenchyma. No intracranial hemorrhage, mass lesion, evidence of acute infarction, or extra-axial fluid collection. Vascular: Normal appearance Skull: Normal appearance Sinuses/Orbits: Clear Other: N/A CT CERVICAL SPINE FINDINGS Alignment: Normal Skull base and vertebrae: Osseous mineralization normal. Visualized skull base intact. Vertebral body heights maintained. No fracture, bone destruction or subluxation. Disc space narrowing with endplate spur formation at C4-C5,  C5-C6, LEFT C6-C7. Encroachment upon cervical neural foramina bilaterally greatest at LEFT C5-C6. Soft tissues and spinal canal: Prevertebral soft tissues normal thickness. Visualized cervical soft tissues grossly unremarkable. Disc levels:  As above Upper chest: Lung apices clear. Other: N/A IMPRESSION: Normal CT head. Degenerative disc disease changes of the cervical spine with encroachment upon cervical neural foramina by uncovertebral spurs as above. No acute cervical spine abnormalities. Electronically Signed   By: Ulyses Southward M.D.   On: 12/05/2017 17:15   Ct Cervical Spine Wo Contrast  Result Date: 12/05/2017 CLINICAL DATA:  Headache, acute severe headache worst of life, LEFT arm numbness and tingling since Thursday worse today EXAM: CT HEAD WITHOUT CONTRAST CT CERVICAL SPINE WITHOUT CONTRAST TECHNIQUE: Multidetector CT imaging of the head and cervical spine was performed following the standard protocol without intravenous contrast. Multiplanar CT image reconstructions of the cervical spine were also generated. COMPARISON:  CT head 11/10/2016 FINDINGS: CT HEAD FINDINGS Brain: Normal ventricular morphology. No midline shift or mass effect. Normal appearance of brain parenchyma. No intracranial hemorrhage, mass lesion, evidence of acute infarction, or extra-axial fluid collection. Vascular: Normal appearance Skull: Normal appearance Sinuses/Orbits: Clear Other: N/A CT CERVICAL SPINE FINDINGS Alignment: Normal Skull base and vertebrae: Osseous mineralization normal. Visualized skull base intact. Vertebral body heights maintained. No fracture, bone destruction or subluxation. Disc space narrowing with endplate spur formation at C4-C5, C5-C6, LEFT C6-C7. Encroachment upon cervical neural foramina bilaterally greatest at LEFT C5-C6. Soft tissues and spinal canal: Prevertebral soft tissues normal thickness. Visualized cervical soft tissues grossly unremarkable. Disc levels:  As above Upper chest: Lung apices  clear. Other: N/A IMPRESSION: Normal CT head. Degenerative disc disease changes of the cervical spine with encroachment upon cervical neural foramina by uncovertebral spurs as above. No acute cervical spine abnormalities. Electronically Signed   By: Ulyses Southward M.D.   On: 12/05/2017 17:15    Procedures Procedures (including critical care time)  Medications Ordered in ED Medications  dexamethasone (DECADRON) injection 10 mg (10 mg Intravenous Given 12/05/17 1711)     Initial Impression / Assessment and Plan / ED Course  I have reviewed the triage vital signs and the nursing notes.  Pertinent labs & imaging  results that were available during my care of the patient were reviewed by me and considered in my medical decision making (see chart for details).    Pt is feeling better.  She will be d/c home with instructions to f/u with NS.  Return if worse.  Final Clinical Impressions(s) / ED Diagnoses   Final diagnoses:  Cervical radiculopathy at C5    ED Discharge Orders        Ordered    predniSONE (STERAPRED UNI-PAK 21 TAB) 10 MG (21) TBPK tablet  Daily     12/05/17 1756    HYDROcodone-acetaminophen (NORCO/VICODIN) 5-325 MG tablet  Every 4 hours PRN     12/05/17 Lambert [id number]       Inri Sobieski, MD 12/05/17 1759

## 2017-12-05 NOTE — ED Notes (Signed)
Pt. Stated, I just saw my neurologist this past Monday. Suppose to do some type of study on me.

## 2017-12-05 NOTE — ED Triage Notes (Signed)
Pt. Stated, Ive had chest pain off and on since Monday. Ive had tingling in my face for 3 months.

## 2017-12-05 NOTE — ED Notes (Signed)
Pt staes she understands instructions. Home stable after teach back performed.

## 2017-12-05 NOTE — ED Notes (Signed)
Pt taken to xray 

## 2018-03-25 DIAGNOSIS — H40013 Open angle with borderline findings, low risk, bilateral: Secondary | ICD-10-CM | POA: Insufficient documentation

## 2018-03-25 DIAGNOSIS — H04123 Dry eye syndrome of bilateral lacrimal glands: Secondary | ICD-10-CM | POA: Insufficient documentation

## 2018-03-25 DIAGNOSIS — Z79899 Other long term (current) drug therapy: Secondary | ICD-10-CM | POA: Insufficient documentation

## 2018-07-08 ENCOUNTER — Emergency Department (HOSPITAL_COMMUNITY)
Admission: EM | Admit: 2018-07-08 | Discharge: 2018-07-08 | Disposition: A | Discharge status: Home / Self Care | Payer: Federal, State, Local not specified - PPO | Attending: Emergency Medicine | Admitting: Emergency Medicine

## 2018-07-08 ENCOUNTER — Emergency Department (HOSPITAL_COMMUNITY): Payer: Federal, State, Local not specified - PPO

## 2018-07-08 ENCOUNTER — Encounter (HOSPITAL_COMMUNITY): Payer: Self-pay | Admitting: Emergency Medicine

## 2018-07-08 ENCOUNTER — Other Ambulatory Visit: Payer: Self-pay

## 2018-07-08 DIAGNOSIS — M25552 Pain in left hip: Secondary | ICD-10-CM | POA: Insufficient documentation

## 2018-07-08 DIAGNOSIS — M5442 Lumbago with sciatica, left side: Secondary | ICD-10-CM | POA: Diagnosis not present

## 2018-07-08 DIAGNOSIS — M545 Low back pain: Secondary | ICD-10-CM | POA: Diagnosis present

## 2018-07-08 DIAGNOSIS — Z79899 Other long term (current) drug therapy: Secondary | ICD-10-CM | POA: Insufficient documentation

## 2018-07-08 LAB — URINALYSIS, ROUTINE W REFLEX MICROSCOPIC
BILIRUBIN URINE: NEGATIVE
Glucose, UA: NEGATIVE mg/dL
HGB URINE DIPSTICK: NEGATIVE
KETONES UR: NEGATIVE mg/dL
Leukocytes, UA: NEGATIVE
Nitrite: NEGATIVE
PROTEIN: NEGATIVE mg/dL
SPECIFIC GRAVITY, URINE: 1.011 (ref 1.005–1.030)
pH: 5 (ref 5.0–8.0)

## 2018-07-08 MED ORDER — PREDNISONE 20 MG PO TABS: 60.0000 mg | ORAL_TABLET | Freq: Every day | ORAL | 0 refills | Status: AC

## 2018-07-08 MED ORDER — LIDOCAINE 5 % EX PTCH
1.0000 | MEDICATED_PATCH | CUTANEOUS | Status: DC
  Administered 2018-07-08: 1 via TRANSDERMAL
  Filled 2018-07-08: qty 1

## 2018-07-08 MED ORDER — METHOCARBAMOL 500 MG PO TABS
1000.0000 mg | ORAL_TABLET | Freq: Once | ORAL | Status: AC
  Administered 2018-07-08: 1000 mg via ORAL
  Filled 2018-07-08: qty 2

## 2018-07-08 MED ORDER — KETOROLAC TROMETHAMINE 30 MG/ML IJ SOLN
30.0000 mg | Freq: Once | INTRAMUSCULAR | Status: AC
  Administered 2018-07-08: 30 mg via INTRAMUSCULAR
  Filled 2018-07-08: qty 1

## 2018-07-08 MED ORDER — METHOCARBAMOL 500 MG PO TABS: 500.0000 mg | ORAL_TABLET | Freq: Two times a day (BID) | ORAL | 0 refills | Status: DC

## 2018-07-08 MED ORDER — PREDNISONE 20 MG PO TABS
60.0000 mg | ORAL_TABLET | Freq: Once | ORAL | Status: AC
  Administered 2018-07-08: 60 mg via ORAL
  Filled 2018-07-08: qty 3

## 2018-07-08 NOTE — ED Provider Notes (Signed)
[facility] EMERGENCY DEPARTMENT Provider Note   CSN: [id number] Arrival date & time: 07/08/18  1509     History   Chief Complaint Chief Complaint  Patient presents with  . Leg Pain  . Hip Pain    HPI Stephanie Kirk is a 52 y.o. female.  Stephanie Kirk is a 52 y.o. Female with a history of rheumatoid arthritis, Sjogren's, fibromyalgia and mitral valve prolapse, who presents to the emergency department for evaluation of pain in her left lower back and left hip radiating down into her left leg.  She reports she had some mild pain in her hips last week that seemed to resolve but later this morning while she was sleeping the pain seemed to return into her left hip and worsen.  She reports pain is worse with movement and palpation and she has had difficulty walking without her walker due to the severity of pain.  She denies numbness she does report that because of pain in the leg feels somewhat weak.  She denies any loss of bowel or bladder control, no saddle anesthesia.  No associated abdominal pain, no nausea or vomiting.  No fevers.  No dysuria or urinary frequency.  Patient does have history of rheumatoid arthritis for which she takes methotrexate, prednisone, Olumiant, gabapentin and duloxetine to help manage this. She did have a fall 2 weeks ago but no recent trauma y  Denies history of cancer or IV drug use.esterday or last week associated with new pain.  She has not taken anything aside from her RA medications for pain reports she tries to avoid pain medication, she does not like the way it makes her feel.     Past Medical History:  Diagnosis Date  . Arthritis    rheumatoid  . Fibromyalgia   . Mitral valve prolapse   . Sjogren's disease [facility])     Patient Active Problem List   Diagnosis Date Noted  . Chronic migraine 04/20/2017  . Memory loss 04/20/2017    History reviewed. No pertinent surgical history.   OB History   None      Home Medications     Prior to Admission medications   Medication Sig Start Date End Date Taking? Authorizing Provider  Calcium Carbonate-Vitamin D3 (CALCIUM 600-D) 600-400 MG-UNIT TABS Take by mouth. Once daily    [provider]  carboxymethylcellulose (REFRESH PLUS) 0.5 % SOLN 1 drop daily as needed.    [provider]  cycloSPORINE (RESTASIS) 0.05 % ophthalmic emulsion Place 1 drop into both eyes 2 (two) times daily.    [provider]  folic acid (FOLVITE) 1 MG tablet Take 1 mg by mouth daily.    [provider]  HYDROcodone-acetaminophen (NORCO/VICODIN) 5-325 MG tablet Take 1-2 tablets by mouth every 4 (four) hours as needed for severe pain. 12/05/17   Jacalyn Lefevre, MD  hydroxychloroquine (PLAQUENIL) 200 MG tablet Take 200 mg by mouth 2 (two) times daily.    [provider]  ibuprofen (ADVIL,MOTRIN) 600 MG tablet Take 1 tablet (600 mg total) every 8 (eight) hours as needed by mouth for moderate pain. 07/19/17   Shaune Pollack, MD  iron polysaccharides (NIFEREX) 150 MG capsule Take 150 mg by mouth daily.    [provider]  magnesium oxide (MAG-OX) 400 MG tablet Take 400 mg by mouth daily.    [provider]  meclizine (ANTIVERT) 25 MG tablet Take 1 tablet (25 mg total) by mouth 3 (three) times daily as needed for dizziness.  11/10/16   Geoffery Lyons, MD  methotrexate (RHEUMATREX) 2.5 MG tablet Take 2.5 mg by mouth once a week. Caution:Chemotherapy. Protect from light.    [provider]  nortriptyline (PAMELOR) 25 MG capsule Take 2 capsules (50 mg total) by mouth at bedtime. 04/20/17   Levert Feinstein, MD  omeprazole (PRILOSEC) 40 MG capsule Take 40 mg by mouth daily.    [provider]  predniSONE (STERAPRED UNI-PAK 21 TAB) 10 MG (21) TBPK tablet Take 1 tablet (10 mg total) by mouth daily. Take 6 tabs by mouth daily  for 2 days, then 5 tabs for 2 days, then 4 tabs for 2 days, then 3 tabs for 2 days, 2 tabs for 2 days, then 1 tab by mouth  daily for 2 days 12/05/17   Jacalyn Lefevre, MD  sulfaSALAzine (AZULFIDINE) 500 MG tablet Take 500 mg by mouth 2 (two) times daily.    [provider]  valACYclovir (VALTREX) 500 MG tablet Take 500 mg by mouth daily.    [provider]  verapamil (CALAN-SR) 120 MG CR tablet Take 120 mg by mouth 2 (two) times daily.    [provider]    Family History Family History  Problem Relation Age of Onset  . Asthma Mother   . Hypertension Mother   . Hypertension Father   . Stroke Father   . Heart attack Father   . Thyroid disease Sister   . Diabetes Maternal Grandmother   . Cancer Paternal Grandmother     Social History Social History   Tobacco Use  . Smoking status: Never Smoker  . Smokeless tobacco: Never Used  Substance Use Topics  . Alcohol use: No    Comment: quit 06/2010  . Drug use: No     Allergies   Patient has no known allergies.   Review of Systems Review of Systems  Constitutional: Negative for chills and fever.  HENT: Negative.   Respiratory: Negative for shortness of breath.   Cardiovascular: Negative for chest pain.  Gastrointestinal: Negative for abdominal pain, constipation, diarrhea, nausea and vomiting.  Genitourinary: Negative for dysuria, flank pain, frequency and hematuria.  Musculoskeletal: Positive for arthralgias, back pain and myalgias. Negative for gait problem, joint swelling and neck pain.  Skin: Negative for color change, rash and wound.  Neurological: Negative for weakness and numbness.     Physical Exam Updated Vital Signs BP 131/80 (BP Location: Left Arm)   Pulse 94   Temp 99.4 F (37.4 C) (Oral)   Resp 18   LMP 11/10/2013   SpO2 100%   Physical Exam  Constitutional: She is oriented to person, place, and time. She appears well-developed and well-nourished. No distress.  HENT:  Head: Atraumatic.  Eyes: Right eye exhibits no discharge. Left eye exhibits no discharge.  Neck: Neck supple.  Cardiovascular:    Pulses:      Radial pulses are 2+ on the right side, and 2+ on the left side.       Dorsalis pedis pulses are 2+ on the right side, and 2+ on the left side.       Posterior tibial pulses are 2+ on the right side, and 2+ on the left side.  Pulmonary/Chest: Effort normal. No respiratory distress.  Abdominal: Soft. Bowel sounds are normal. She exhibits no distension and no mass. There is no tenderness. There is no guarding.  Abdomen soft, nondistended, nontender to palpation in all quadrants without guarding or peritoneal signs, no CVA tenderness bilaterally  Musculoskeletal:  Tenderness  to palpation over left lower back and left hip.  Pain made worse with range of motion of the lower extremities, positive straight leg raise on the left.  Neurological: She is alert and oriented to person, place, and time.  Alert, clear speech, following commands. Moving all extremities without difficulty. Bilateral lower extremities with 5/5 strength in proximal and distal muscle groups and with dorsi and plantar flexion. Sensation intact in bilateral lower extremities. 2+ patellar DTRs bilaterally. Ambulatory with steady gait  Skin: Skin is warm and dry. Capillary refill takes less than 2 seconds. She is not diaphoretic.  Psychiatric: She has a normal mood and affect. Her behavior is normal.  Nursing note and vitals reviewed.    ED Treatments / Results  Labs (all labs ordered are listed, but only abnormal results are displayed) Labs Reviewed  URINALYSIS, ROUTINE W REFLEX MICROSCOPIC    EKG None  Radiology Ct Lumbar Spine Wo Contrast  Result Date: 07/08/2018 CLINICAL DATA:  52 y/o F; left hip pain radiating down the left lower extremity. Fall 2 weeks ago. EXAM: CT LUMBAR SPINE WITHOUT CONTRAST TECHNIQUE: Multidetector CT imaging of the lumbar spine was performed without intravenous contrast administration. Multiplanar CT image reconstructions were also generated. COMPARISON:  07/19/2017 lumbar  spine radiographs FINDINGS: Segmentation: 5 lumbar type vertebrae. Alignment: Mild lumbar levocurvature with apex at L5. Normal lumbar lordosis without listhesis. 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 Paraspinal and other soft tissues: Negative. Disc levels: Small disc bulges at the L2-3 through L5-S1 levels. Facet arthrosis at L3-4 and L4-5 with facet hypertrophy. L3-4 facet vacuum phenomenon. No high-grade foraminal or canal stenosis. IMPRESSION: 1. No acute fracture or dislocation identified. 2. Mild lumbar levocurvature with apex at L5. 3. Mild lower lumbar spine spondylosis. No high-grade foraminal or canal stenosis. Electronically Signed   By: Mitzi Hansen M.D.   On: 07/08/2018 20:29   Dg Hip Unilat W Or Wo Pelvis 2-3 Views Left  Result Date: 07/08/2018 CLINICAL DATA:  Acute LEFT hip pain.  Initial encounter. EXAM: DG HIP (WITH OR WITHOUT PELVIS) 2-3V LEFT COMPARISON:  None. FINDINGS: There is no evidence of hip fracture or dislocation. There is no evidence of arthropathy or other focal bone abnormality. IMPRESSION: Negative. Electronically Signed   By: Harmon Pier M.D.   On: 07/08/2018 16:17    Procedures Procedures (including critical care time)  Medications Ordered in ED Medications  lidocaine (LIDODERM) 5 % 1 patch (1 patch Transdermal Patch Applied 07/08/18 2124)  ketorolac (TORADOL) 30 MG/ML injection 30 mg (30 mg Intramuscular Given 07/08/18 2118)  methocarbamol (ROBAXIN) tablet 1,000 mg (1,000 mg Oral Given 07/08/18 2118)  predniSONE (DELTASONE) tablet 60 mg (60 mg Oral Given 07/08/18 2118)     Initial Impression / Assessment and Plan / ED Course  I have reviewed the triage vital signs and the nursing notes.  Pertinent labs & imaging results that were available during my care of the patient were reviewed by me and considered in my medical decision making (see chart for details).  Normal  neurological exam, no evidence of urinary incontinence or retention, pain is consistently reproducible. There is no evidence of AAA or concern for dissection at this time.   Patient can walk but states is painful.  No loss of bowel or bladder control.  No concern for cauda equina.  No fever, night sweats, weight loss, h/o cancer, IVDU.  Left hip imaging is unremarkable.  And CT of the lumbar  spine shows some mild degenerative changes without any areas of severe foraminal stenosis and no evidence of fractures.  Pain treated here in the department with adequate improvement. RICE protocol and pain medicine indicated and discussed with patient. I have also discussed reasons to return immediately to the ER.  Patient expresses understanding and agrees with plan.  Final Clinical Impressions(s) / ED Diagnoses   Final diagnoses:  Acute left-sided low back pain with left-sided sciatica  Left hip pain    ED Discharge Orders         Ordered    methocarbamol (ROBAXIN) 500 MG tablet  2 times daily     07/08/18 2152    predniSONE (DELTASONE) 20 MG tablet  Daily     07/08/18 2152           Cherie, Lasalle, PA-C 07/08/18 2301    Jacalyn Lefevre, MD 07/08/18 2306

## 2018-07-08 NOTE — ED Triage Notes (Signed)
Pt presents with left hip pain with rad down the left leg. She states that the pain is unbearable. Denies urinary symptoms and had a fall 2 weeks ago. Hx of RA. Denies fever or SOB. The patient is alert and oriented x4 with no acute distress at triage.

## 2018-07-08 NOTE — ED Notes (Signed)
Pt declines pain medication. She will take her RA medication.

## 2018-07-08 NOTE — Discharge Instructions (Addendum)
You were seen here today for Back Pain: Low back pain is discomfort in the lower back that may be due to injuries to muscles and ligaments around the spine. Occasionally, it may be caused by a problem to a part of the spine called a disc. Your back pain should be treated with medicines listed below as well as back exercises and this back pain should get better over the next 2 weeks. Most patients get completely well in 4 weeks. It is important to know however, if you develop severe or worsening pain, low back pain with fever, numbness, weakness or inability to walk or urinate, you should return to the ER immediately.  Please follow up with your doctor this week for a recheck if still having symptoms.  HOME INSTRUCTIONS Self - care:  The application of heat can help soothe the pain.  Maintaining your daily activities, including walking (this is encouraged), as it will help you get better faster than just staying in bed. Do not life, push, pull anything more than 10 pounds for the next week. I am attaching back exercises that you can do at home to help facilitate your recovery.  You can also use over-the-counter salon pas lidocaine patches, ice and heat.  Back Exercises - I have attached a handout on back exercises that can be done at home to help facilitate your recovery.   Medications are also useful to help with pain control.   Acetaminophen.  This medication is generally safe, and found over the counter. Take as directed for your age. You should not take more than 8 of the extra strength (500mg ) pills a day (max dose is 4000mg  total OVER one day)  Muscle relaxants:  These medications can help with muscle tightness that is a cause of lower back pain.  Most of these medications can cause drowsiness, and it is not safe to drive or use dangerous machinery while taking them. They are primarily helpful when taken at night before sleep.  Prednisone - This is an oral steroid.  This medication is best taken  with food in the morning.  Please note that this medication can cause anxiety, mood swings, muscle fatigue, increased hunger, weight gain (sodium/fluid retention), poor sleep as well as other symptoms. If you are a diabetic, please monitor your blood sugars at home as this medication can increase your blood sugars. Call your pharmacist if you have any questions.  You will need to follow up with your primary healthcare provider in 1 week for reassessment and persistent symptoms.  Be aware that if you develop new symptoms, such as a fever, leg weakness, difficulty with or loss of control of your urine or bowels, abdominal pain, or more severe pain, you will need to seek medical attention and/or return to the Emergency department.   Additional Information:  Your vital signs today were: BP 131/80 (BP Location: Left Arm)    Pulse 94    Temp 99.4 F (37.4 C) (Oral)    Resp 18    LMP 11/10/2013    SpO2 100%  If your blood pressure (BP) was elevated above 135/85 this visit, please have this repeated by your doctor within one month. ---------------

## 2018-07-08 NOTE — ED Notes (Signed)
Patient verbalizes understanding of discharge instructions. Opportunity for questioning and answers were provided. Armband removed by staff, pt discharged from ED. Pt wheeled to lobby with family.  

## 2018-10-07 DIAGNOSIS — A6000 Herpesviral infection of urogenital system, unspecified: Secondary | ICD-10-CM | POA: Insufficient documentation

## 2018-10-07 DIAGNOSIS — Z8679 Personal history of other diseases of the circulatory system: Secondary | ICD-10-CM | POA: Insufficient documentation

## 2019-08-19 DIAGNOSIS — E611 Iron deficiency: Secondary | ICD-10-CM | POA: Insufficient documentation

## 2019-08-19 DIAGNOSIS — D649 Anemia, unspecified: Secondary | ICD-10-CM | POA: Insufficient documentation

## 2020-03-10 IMAGING — DX DG CHEST 2V
2 series · 2 of 2 positions shown · non-contrast
Comparison: None.

CLINICAL DATA: Chest pressure and pain.

EXAM:
CHEST - 2 VIEW

[w chest pa]
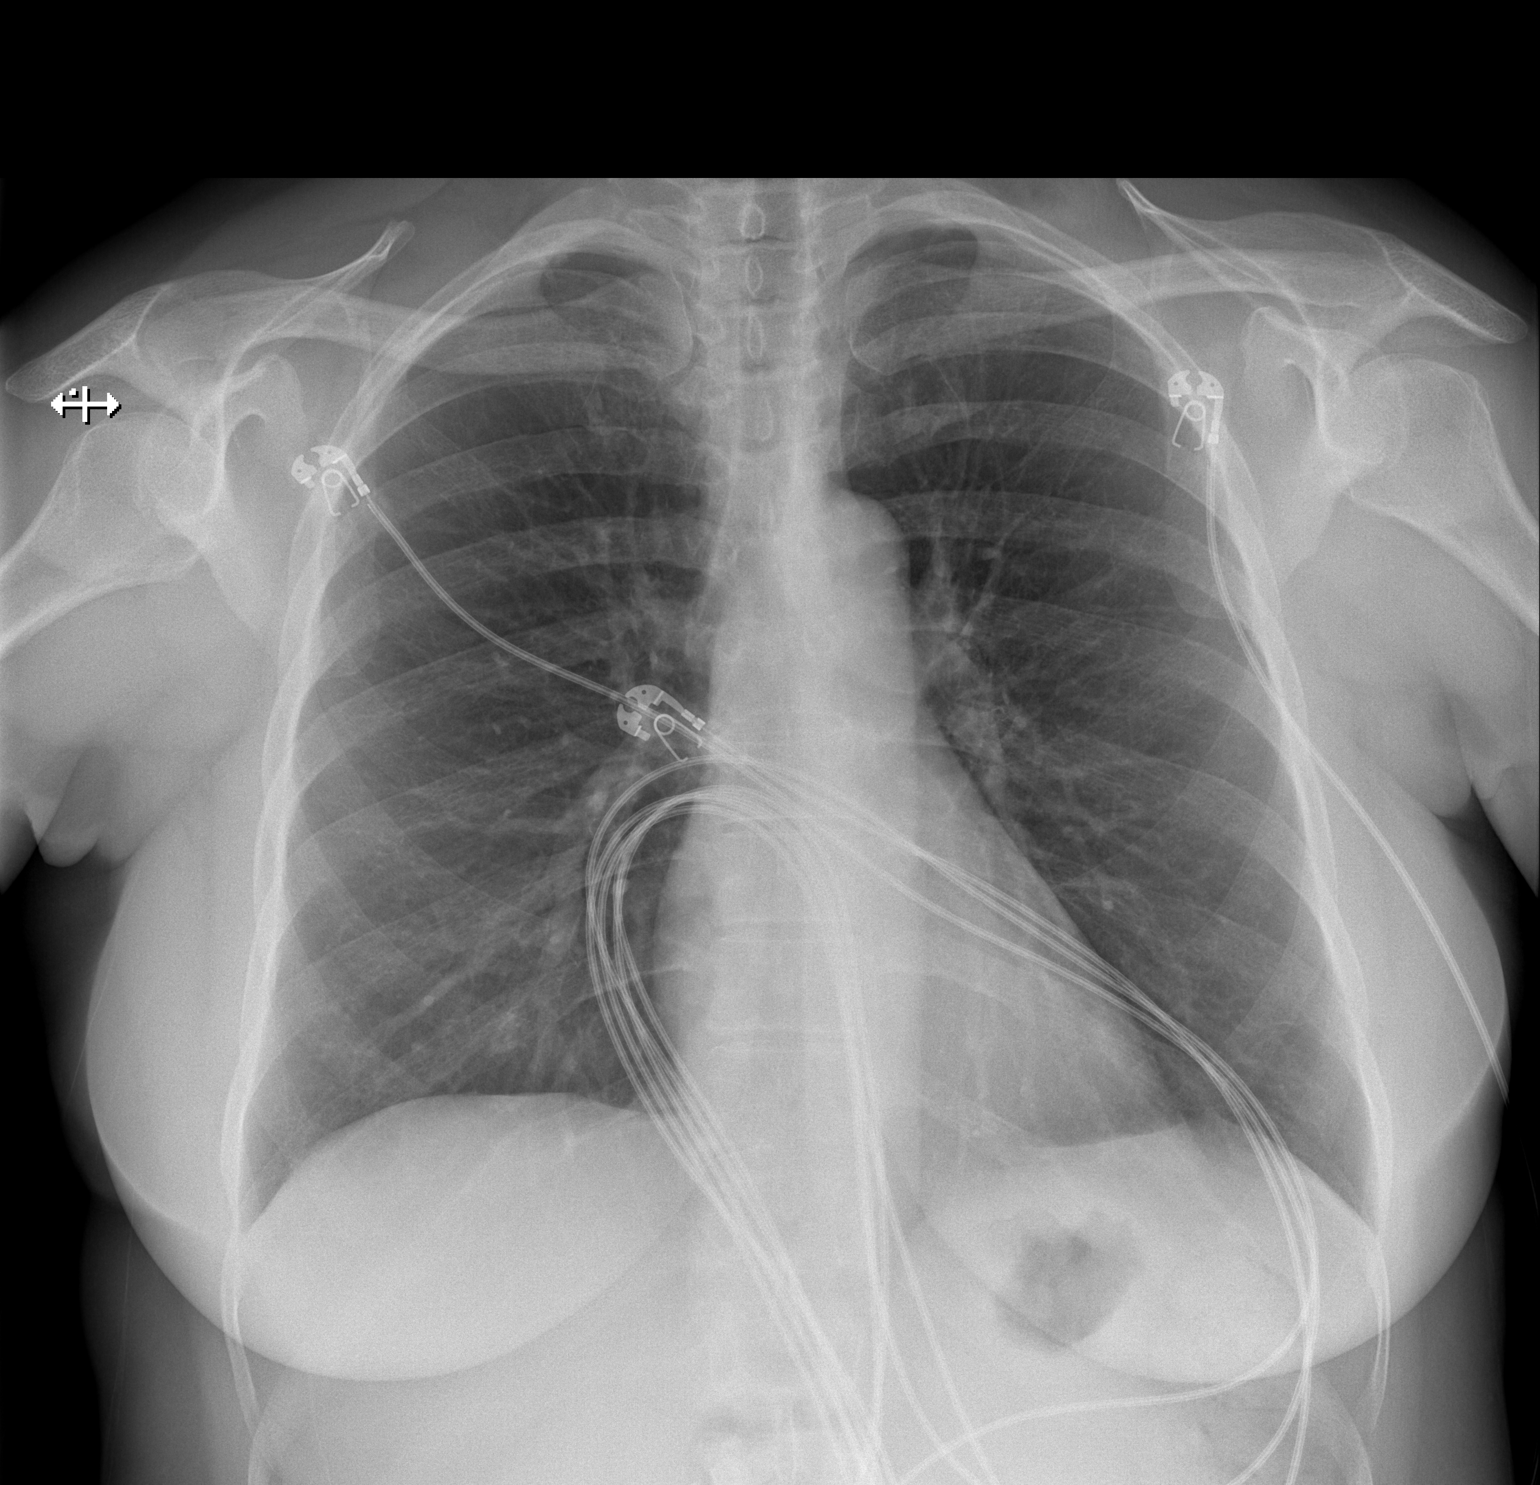

[w chest lat]
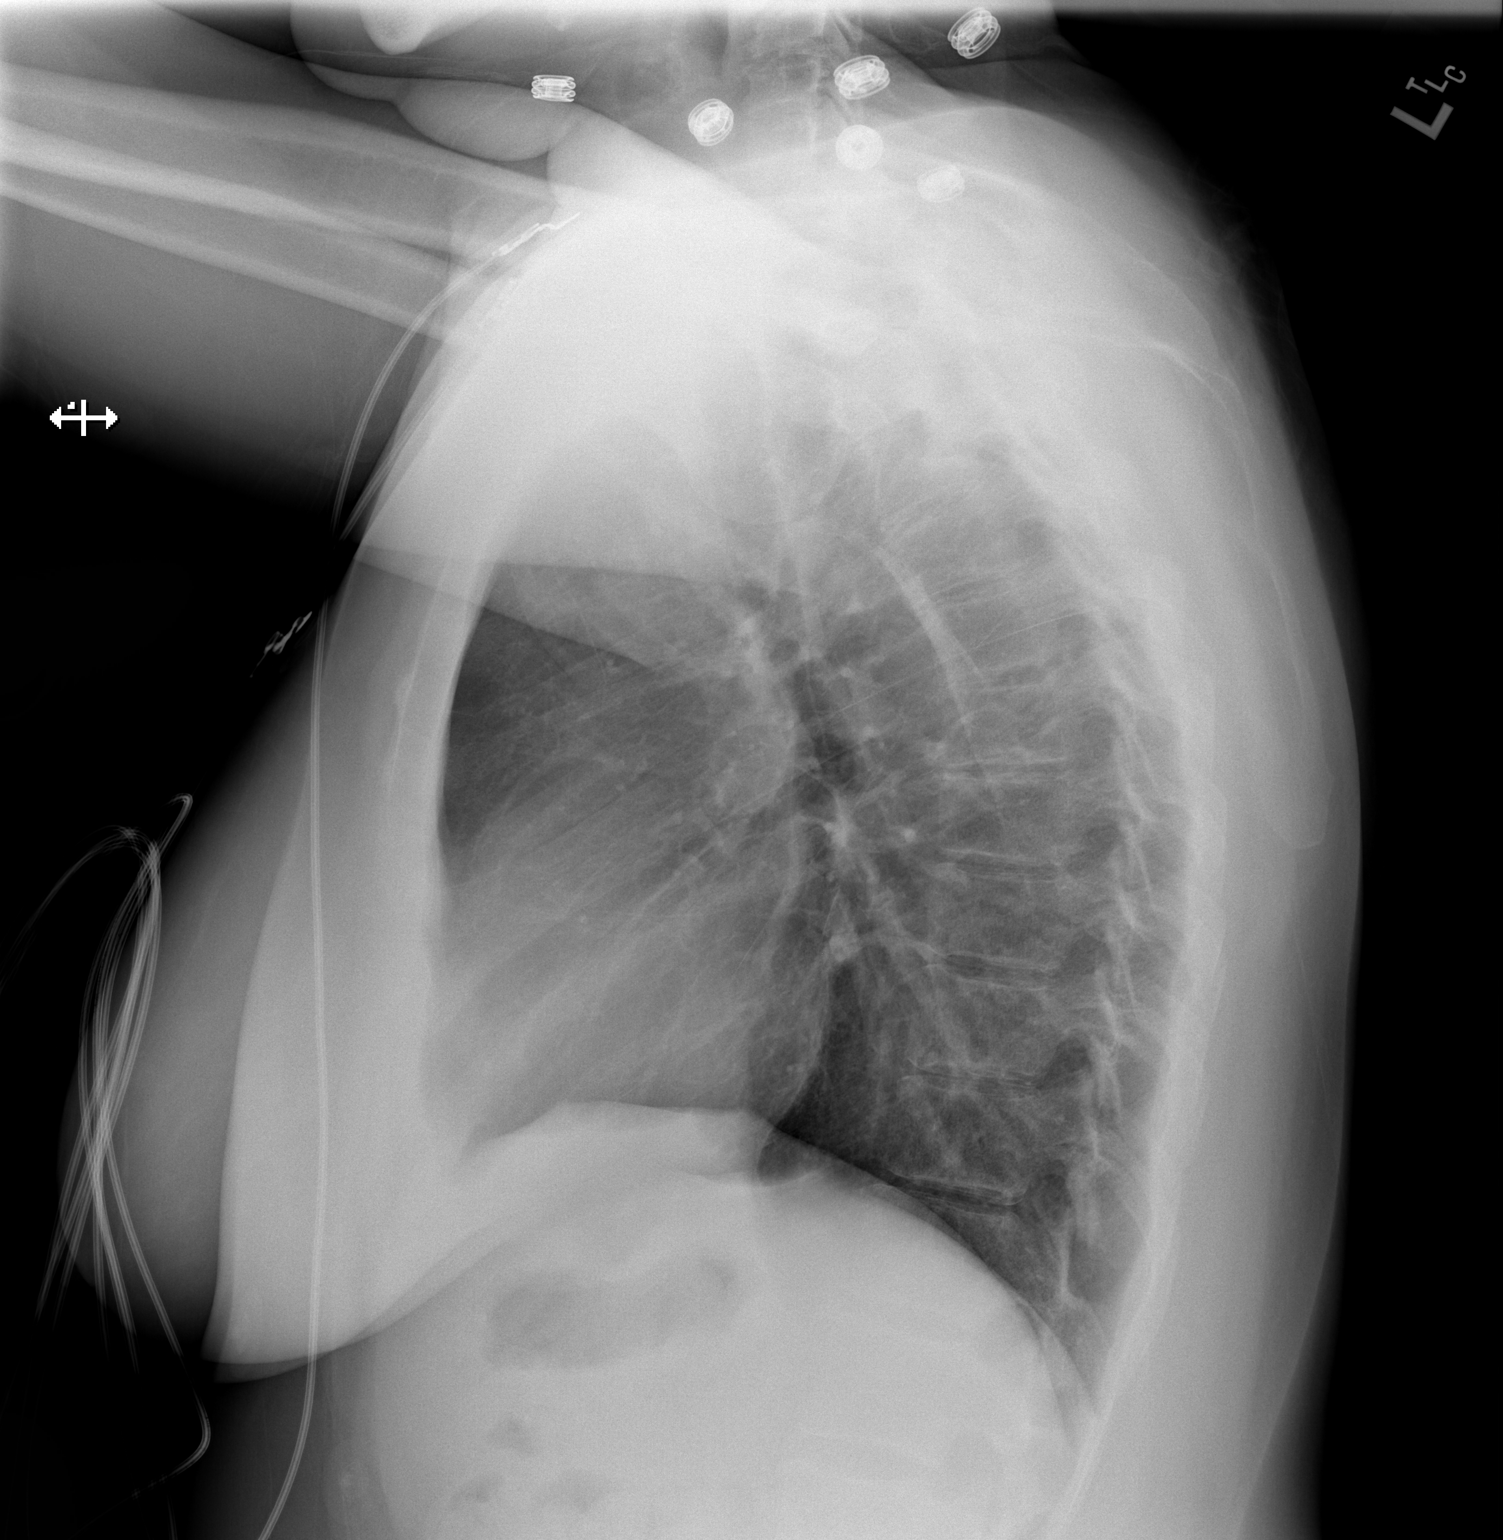

[2 of 2 positions shown; findings below may reference images not displayed]

FINDINGS: The lungs are clear without focal pneumonia, edema, pneumothorax or
pleural effusion. The cardiopericardial silhouette is within normal
limits for size. The visualized bony structures of the thorax are
intact. Telemetry leads overlie the chest.
IMPRESSION: No active cardiopulmonary disease.

## 2020-04-05 DIAGNOSIS — H21233 Degeneration of iris (pigmentary), bilateral: Secondary | ICD-10-CM | POA: Insufficient documentation

## 2020-04-11 DIAGNOSIS — M7918 Myalgia, other site: Secondary | ICD-10-CM | POA: Insufficient documentation

## 2020-04-11 DIAGNOSIS — M542 Cervicalgia: Secondary | ICD-10-CM | POA: Insufficient documentation

## 2020-10-11 IMAGING — DX DG HIP (WITH OR WITHOUT PELVIS) 2-3V*L*
3 series · 3 of 3 positions shown · non-contrast
Comparison: None.

CLINICAL DATA: Acute LEFT hip pain.  Initial encounter.

EXAM:
DG HIP (WITH OR WITHOUT PELVIS) 2-3V LEFT

[pelvis ap]
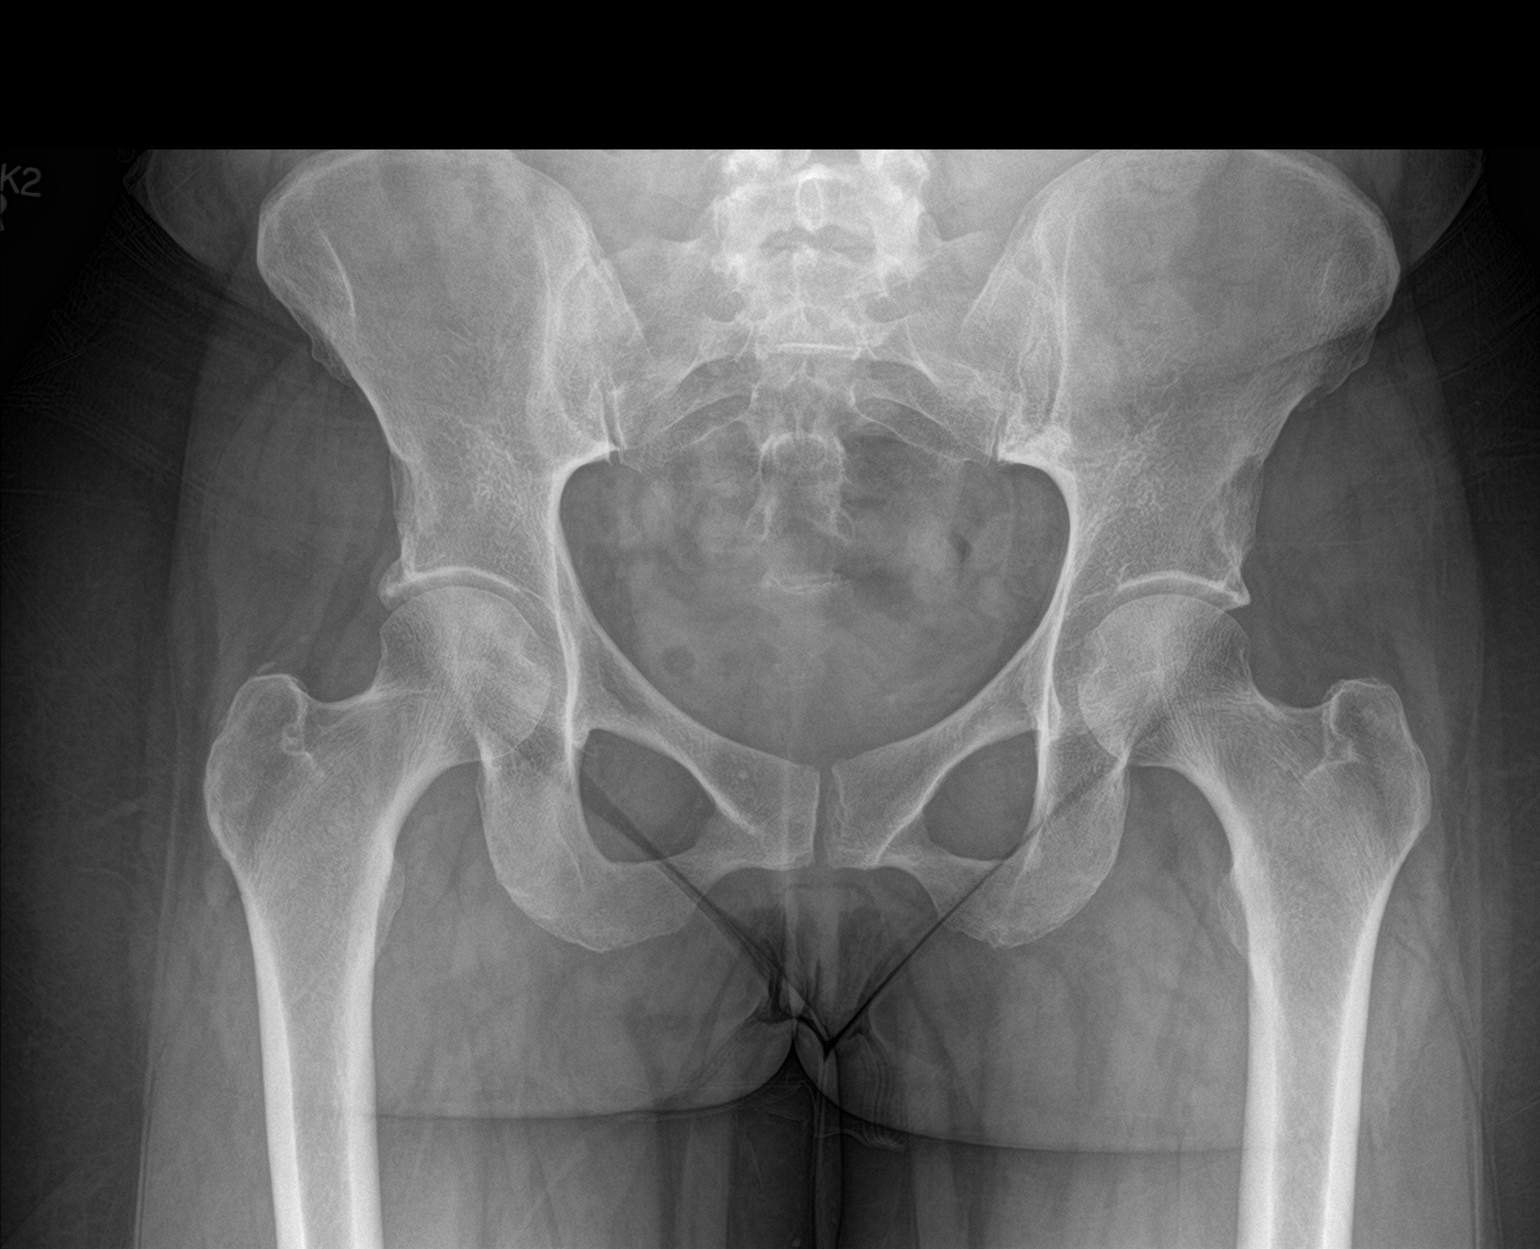

[hip ap]
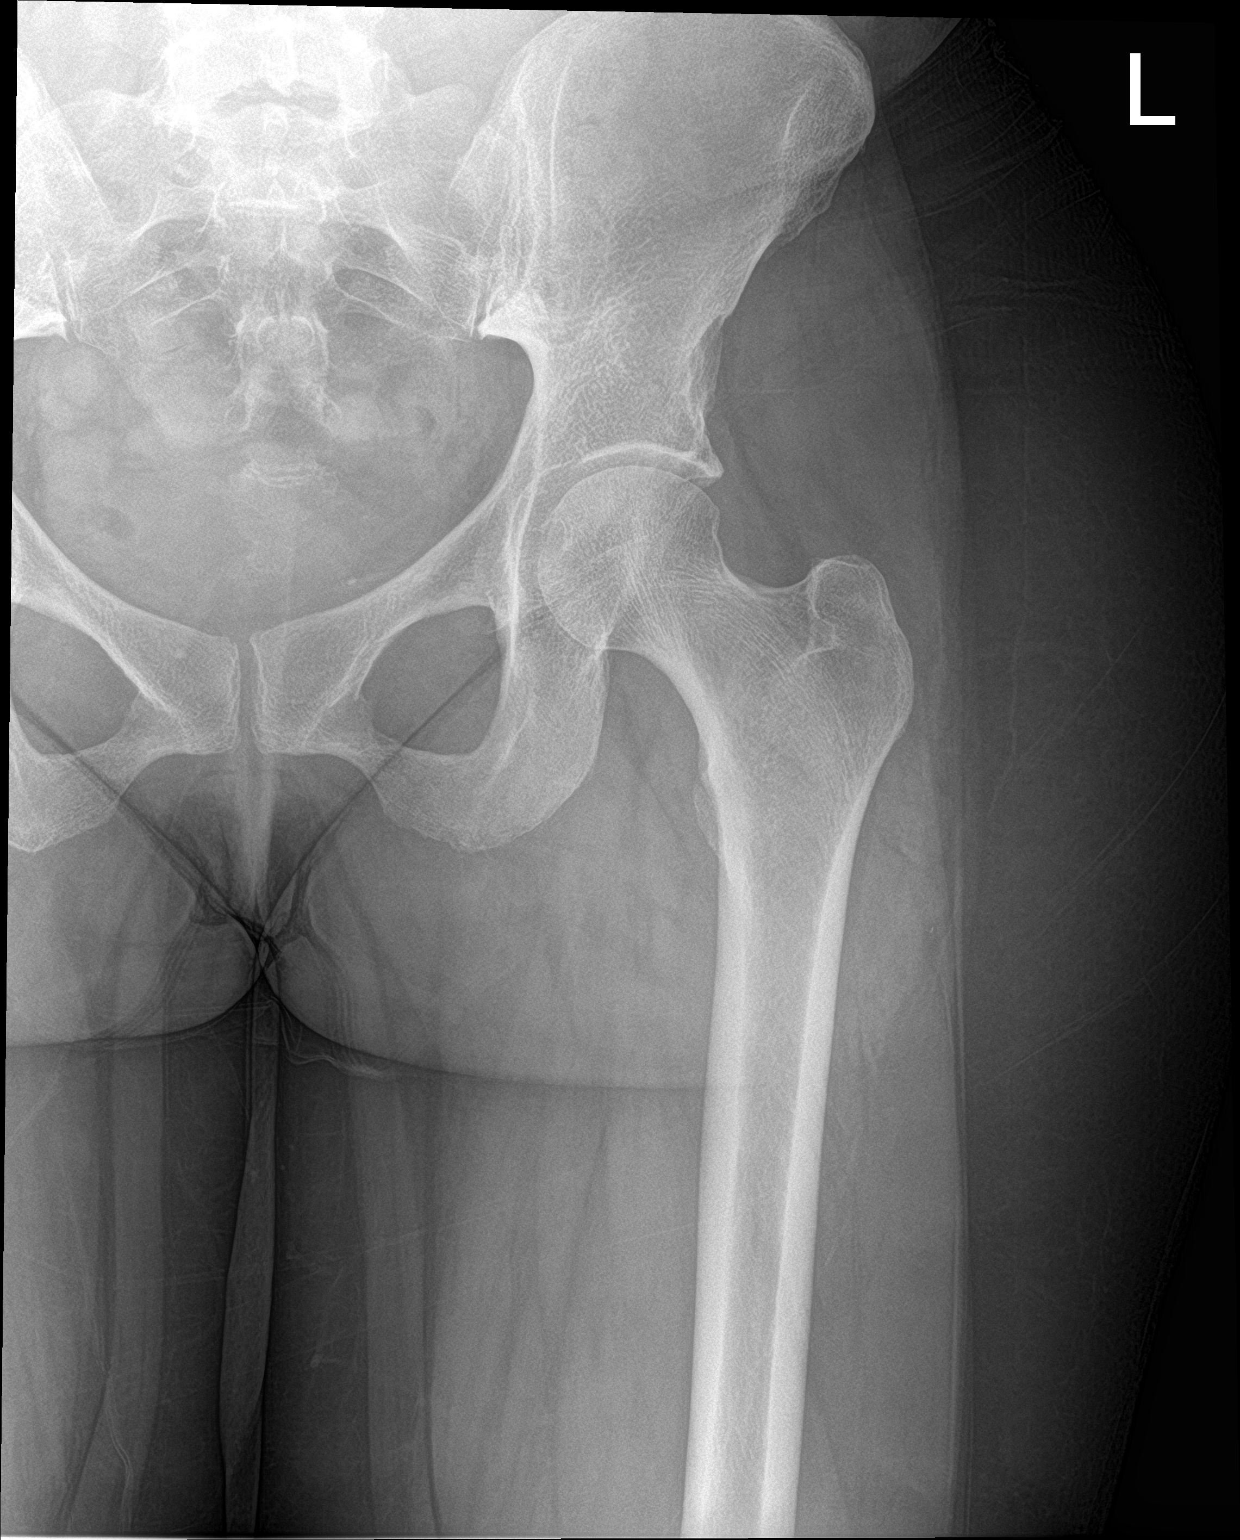

[hip lat]
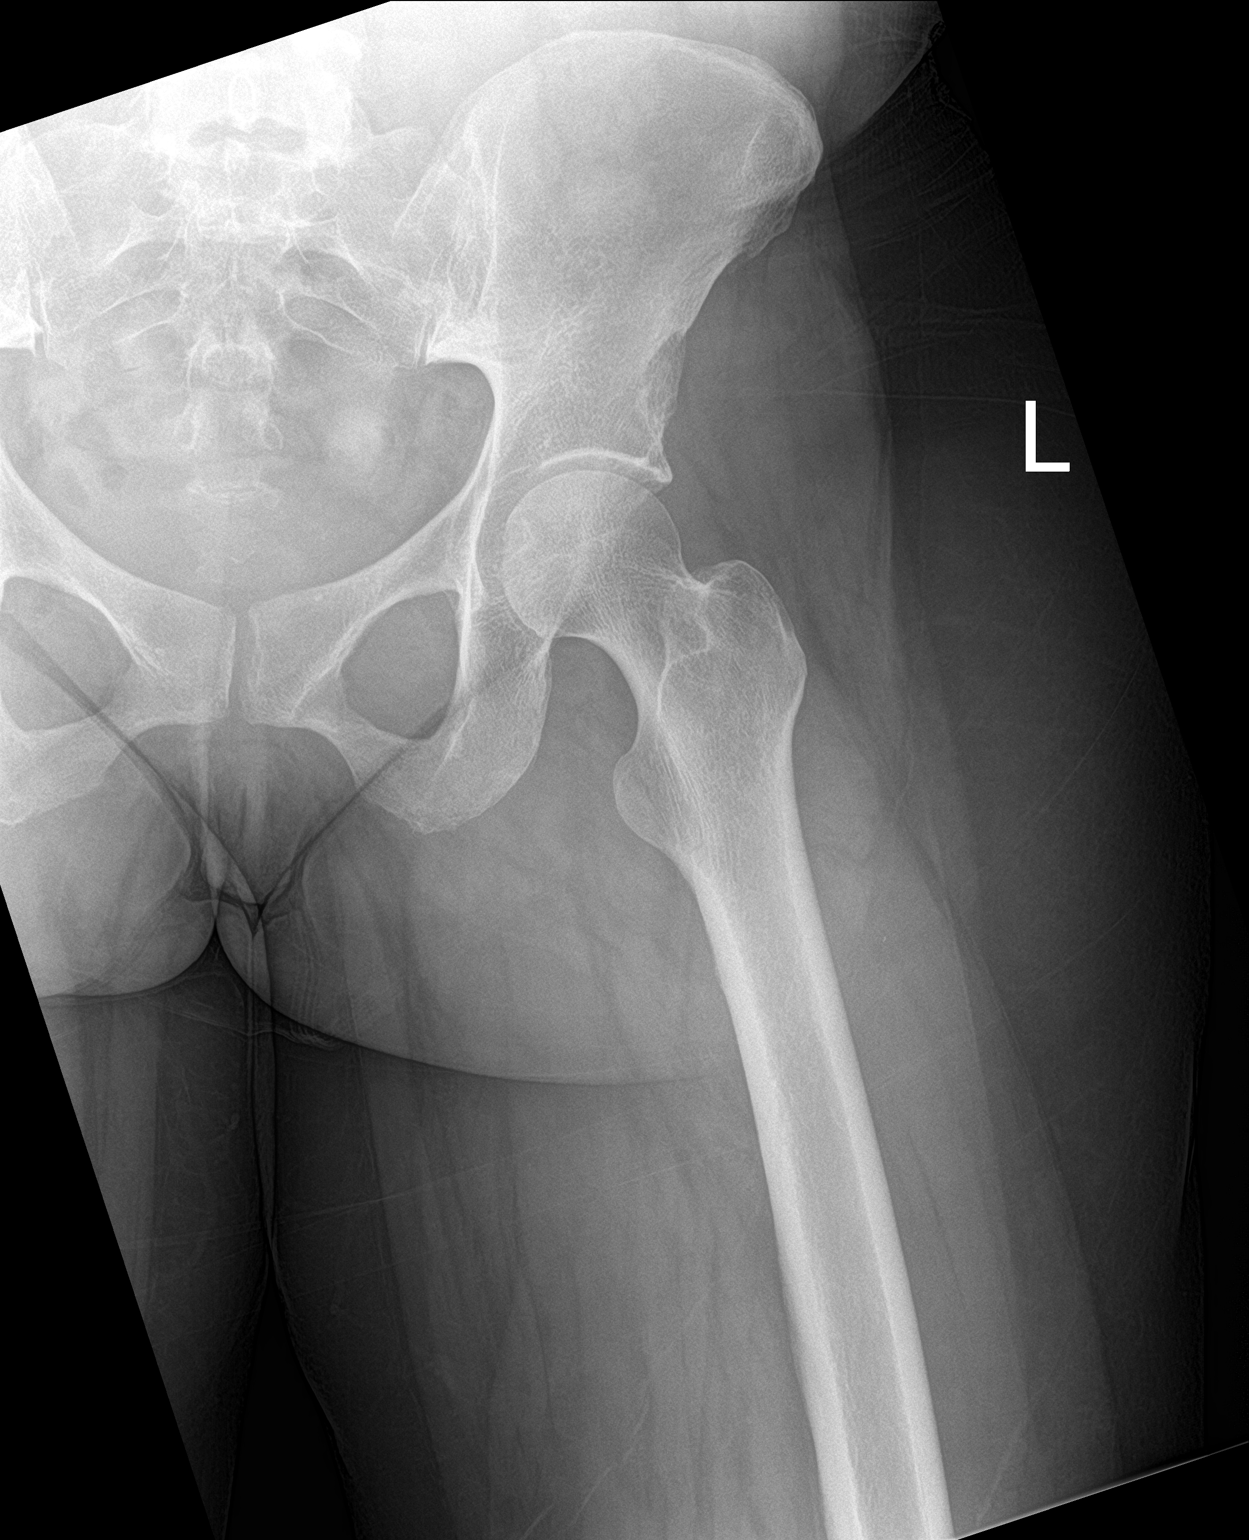

[3 of 3 positions shown; findings below may reference images not displayed]

FINDINGS: There is no evidence of hip fracture or dislocation. There is no
evidence of arthropathy or other focal bone abnormality.
IMPRESSION: Negative.

## 2020-10-11 IMAGING — CT CT L SPINE W/O CM
3 series · 12 of 33 positions shown, 14 images · non-contrast
Comparison: 07/19/2017 lumbar spine radiographs

CLINICAL DATA: 52 y/o F; left hip pain radiating down the left
lower extremity. Fall 2 weeks ago.

EXAM:
CT LUMBAR SPINE WITHOUT CONTRAST
TECHNIQUE: Multidetector CT imaging of the lumbar spine was performed without
intravenous contrast administration. Multiplanar CT image
reconstructions were also generated.

[Series 5: l spine 2.0 st · axial · 0.34mm/px · z∈[+896,+1068]mm · 4 of 124 slices shown, 5 images]
[im 19/124  soft-tissue]
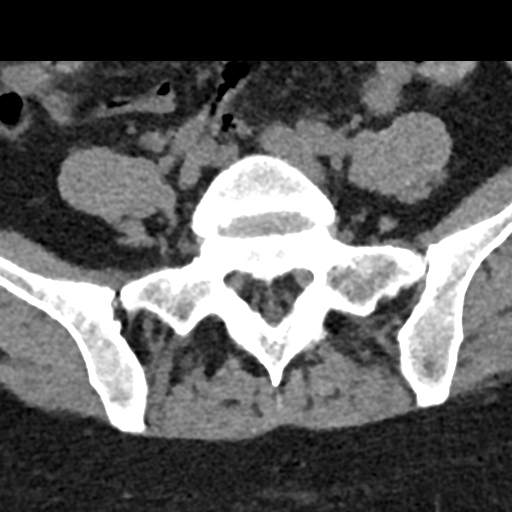
[im 19/124  bone]
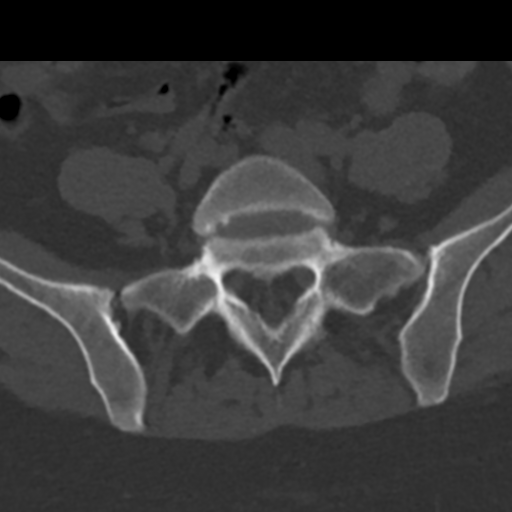
[im 48/124  bone]
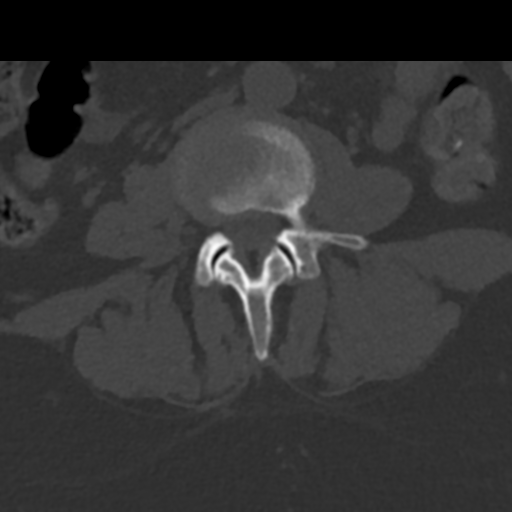
[im 76/124  bone]
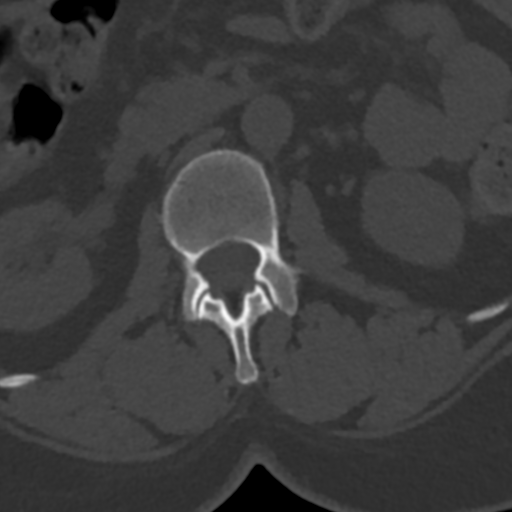
[im 105/124  bone]
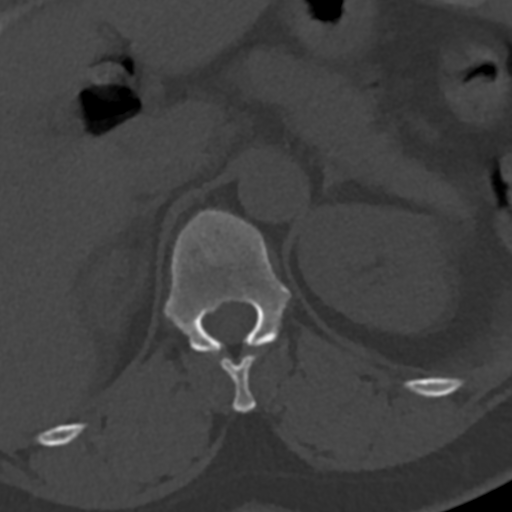

[Series 8: coronal st · coronal · 0.23mm/px · 3 of 69 slices shown]
[im 14/69  bone]
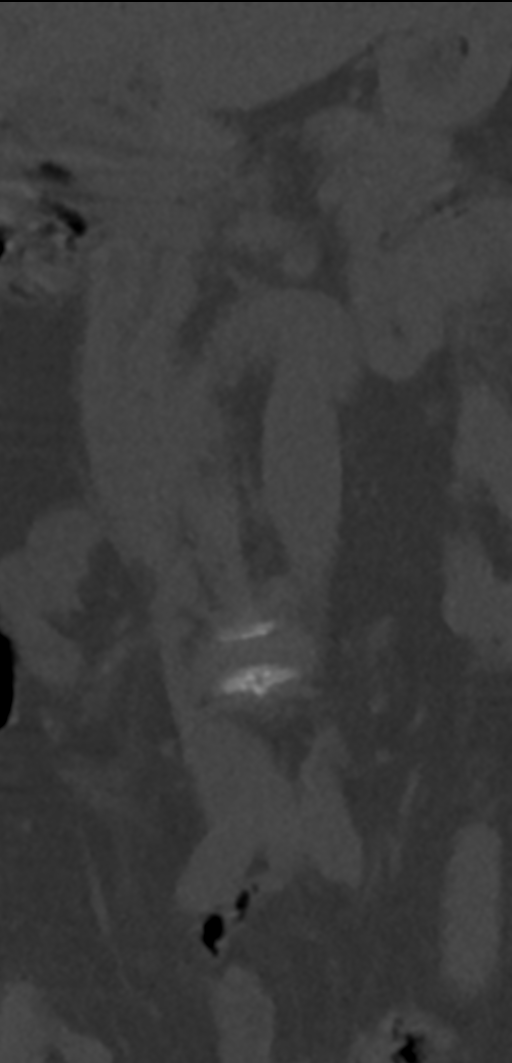
[im 28/69  bone]
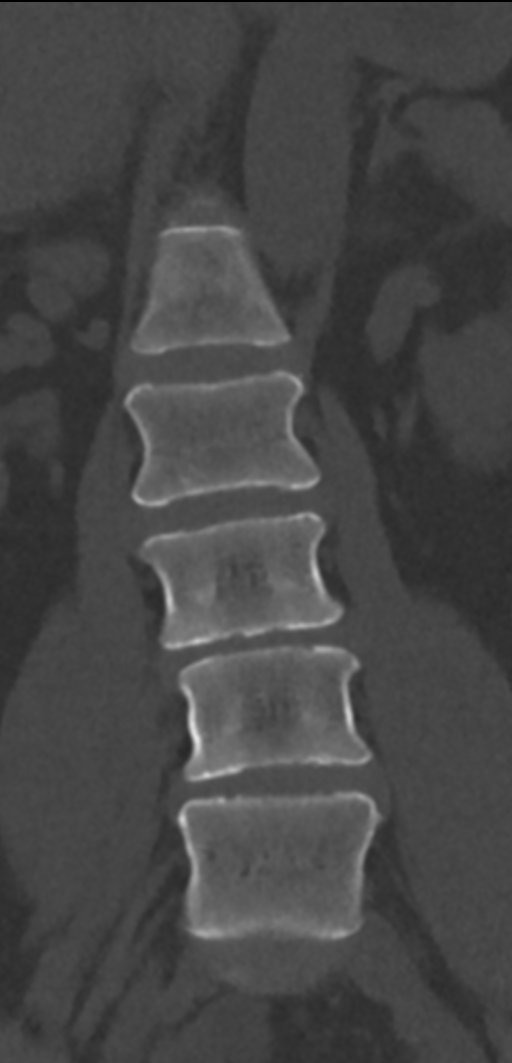
[im 41/69  bone]
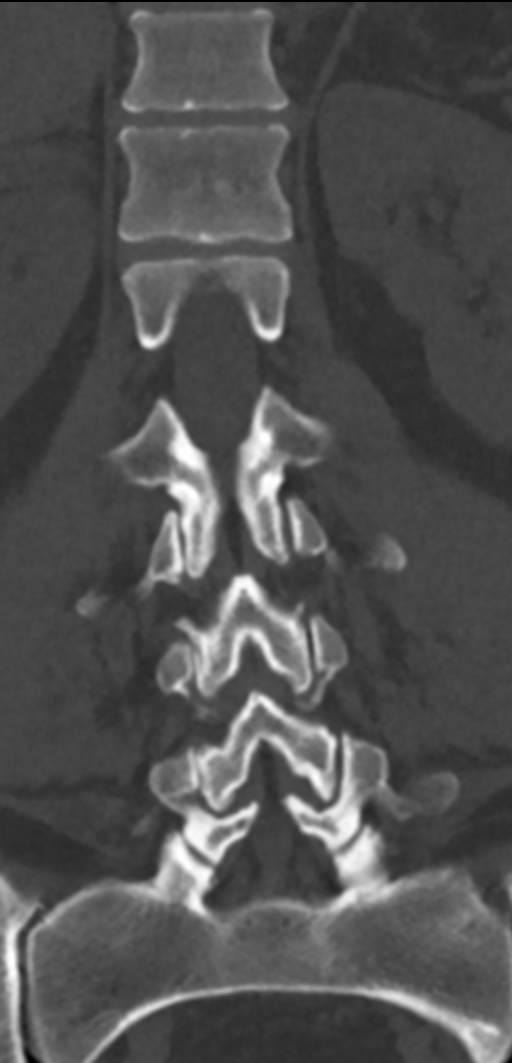

[Series 9: sagittal st · sagittal · 0.36mm/px · 5 of 61 slices shown, 6 images]
[im 21/61  bone]
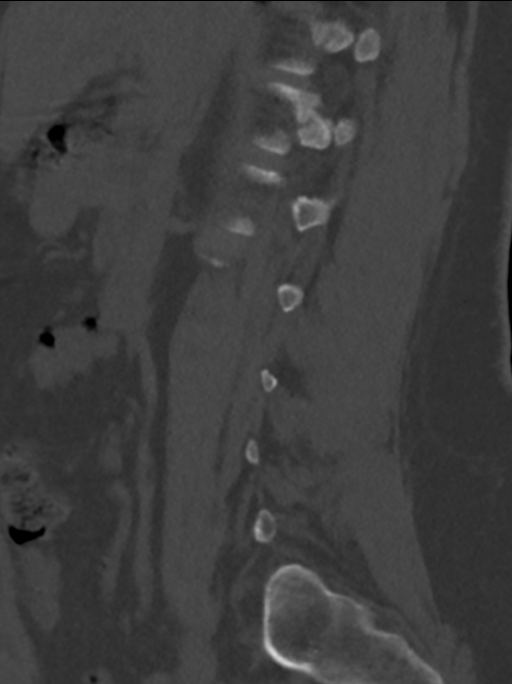
[im 26/61  bone]
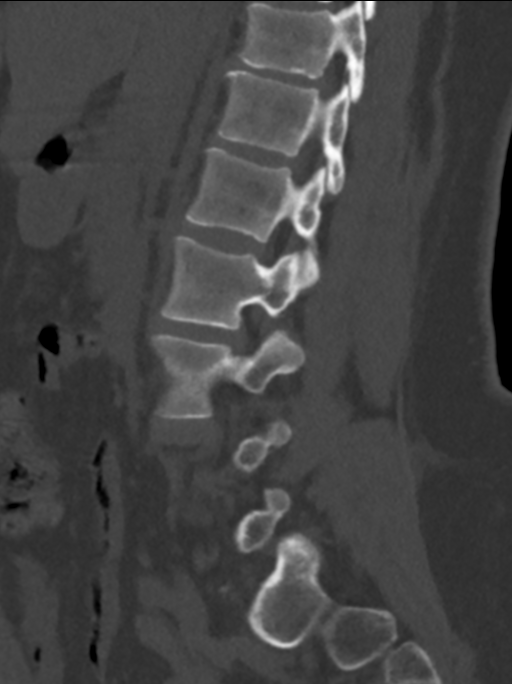
[im 31/61  soft-tissue]
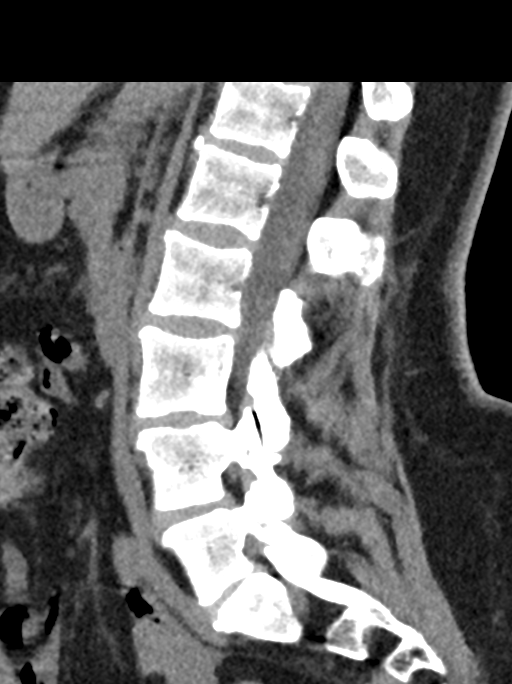
[im 31/61  bone]
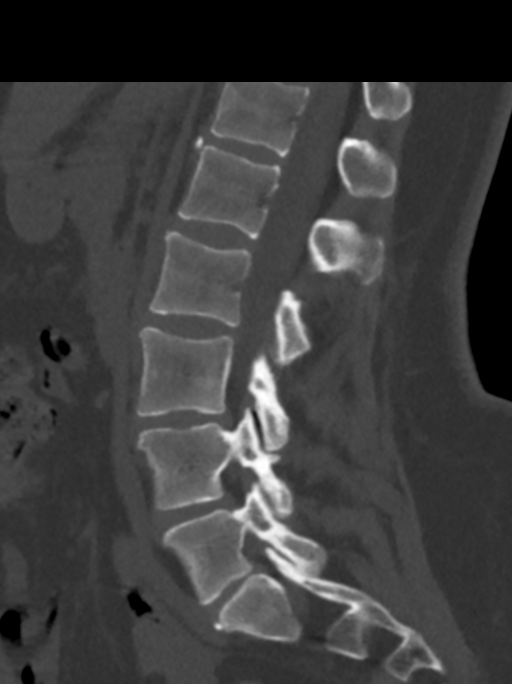
[im 36/61  bone]
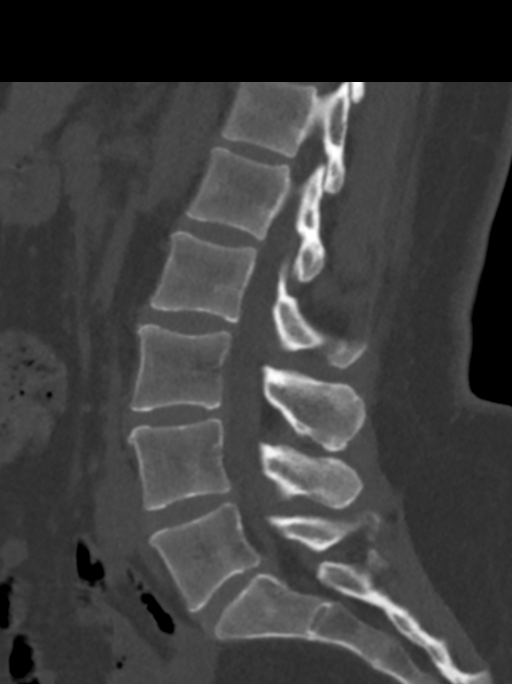
[im 41/61  bone]
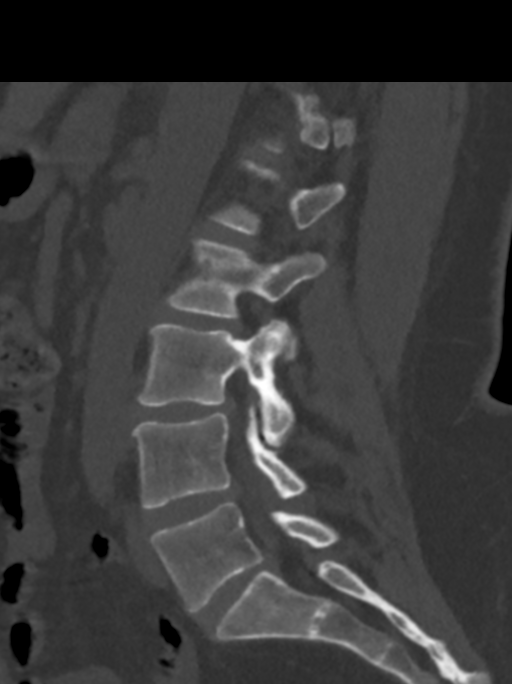

[12 of 33 positions shown; findings below may reference images not displayed]

FINDINGS: Segmentation: 5 lumbar type vertebrae.

Alignment: Mild lumbar levocurvature with apex at L5. Normal lumbar
lordosis without listhesis.

Vertebrae: Sclerosis within left ilium adjacent to the sacroiliac
joints compatible with osteitis condensans ilii.

Paraspinal and other soft tissues: Negative.

Disc levels: Small disc bulges at the L2-3 through L5-S1 levels.
Facet arthrosis at L3-4 and L4-5 with facet hypertrophy. L3-4 facet
vacuum phenomenon. No high-grade foraminal or canal stenosis.
IMPRESSION: 1. No acute fracture or dislocation identified.
2. Mild lumbar levocurvature with apex at L5.
3. Mild lower lumbar spine spondylosis. No high-grade foraminal or
canal stenosis.

By: Kofuna Sepango Wadenga M.D.

## 2021-05-24 ENCOUNTER — Ambulatory Visit
Admission: EM | Admit: 2021-05-24 | Discharge: 2021-05-24 | Disposition: A | Discharge status: Home / Self Care | Payer: Medicare Other | Attending: Physician Assistant | Admitting: Physician Assistant

## 2021-05-24 ENCOUNTER — Encounter: Payer: Self-pay | Admitting: Emergency Medicine

## 2021-05-24 DIAGNOSIS — D84821 Immunodeficiency due to drugs: Secondary | ICD-10-CM | POA: Diagnosis not present

## 2021-05-24 DIAGNOSIS — R5381 Other malaise: Secondary | ICD-10-CM

## 2021-05-24 DIAGNOSIS — R52 Pain, unspecified: Secondary | ICD-10-CM

## 2021-05-24 DIAGNOSIS — J069 Acute upper respiratory infection, unspecified: Secondary | ICD-10-CM | POA: Diagnosis not present

## 2021-05-24 DIAGNOSIS — T380X5A Adverse effect of glucocorticoids and synthetic analogues, initial encounter: Secondary | ICD-10-CM

## 2021-05-24 DIAGNOSIS — R5383 Other fatigue: Secondary | ICD-10-CM

## 2021-05-24 DIAGNOSIS — Z7952 Long term (current) use of systemic steroids: Secondary | ICD-10-CM

## 2021-05-24 DIAGNOSIS — R509 Fever, unspecified: Secondary | ICD-10-CM

## 2021-05-24 MED ORDER — ACETAMINOPHEN 500 MG PO TABS
1000.0000 mg | ORAL_TABLET | Freq: Once | ORAL | Status: AC
  Administered 2021-05-24: 1000 mg via ORAL

## 2021-05-24 MED ORDER — PREDNISONE 10 MG (21) PO TBPK: ORAL_TABLET | ORAL | 0 refills | Status: DC

## 2021-05-24 MED ORDER — ACETAMINOPHEN 325 MG PO TABS: 975.0000 mg | ORAL_TABLET | Freq: Once | ORAL | Status: DC

## 2021-05-24 MED ORDER — IBUPROFEN 800 MG PO TABS
800.0000 mg | ORAL_TABLET | Freq: Once | ORAL | Status: AC
  Administered 2021-05-24: 800 mg via ORAL

## 2021-05-24 NOTE — ED Provider Notes (Signed)
[facility]    CSN: [id number] Arrival date & time: 05/24/21  1519      History   Chief Complaint Chief Complaint  Patient presents with   Fever    HPI Stephanie Kirk is a 55 y.o. female.   Patient presents today with a several day history of URI symptoms.  She reports fever with T-max of 102 F, body aches, headache, fatigue, malaise, diarrhea.  She denies any abdominal pain, nausea, vomiting, chest pain, shortness of breath, nasal congestion, cough.  She does report her son tested positive for COVID-19.  She did take an at-home COVID test but reports that this was negative.  She denies any additional known sick contacts.  She has taken Mucinex and Tylenol without improvement of symptoms.  She is immunosuppressed due to chronic steroid use as well as methotrexate to treat several autoimmune conditions including rheumatoid arthritis and Sjogren's.  Reports last dose of methotrexate was several days ago.  She has been vaccinated against COVID-19 as well as 2 boosters.  She denies any recent antibiotic use.  Reports difficulty with daily activities as result of symptoms.   Past Medical History:  Diagnosis Date   Arthritis    rheumatoid   Fibromyalgia    Mitral valve prolapse    Sjogren's disease [facility])     Patient Active Problem List   Diagnosis Date Noted   Chronic migraine 04/20/2017   Memory loss 04/20/2017    History reviewed. No pertinent surgical history.  OB History   No obstetric history on file.      Home Medications    Prior to Admission medications   Medication Sig Start Date End Date Taking? Authorizing Provider  Calcium Carbonate-Vitamin D3 600-400 MG-UNIT TABS Take by mouth. Once daily   Yes [provider]  carboxymethylcellulose (REFRESH PLUS) 0.5 % SOLN 1 drop daily as needed.   Yes [provider]  cycloSPORINE (RESTASIS) 0.05 % ophthalmic emulsion Place 1 drop into both eyes 2 (two) times daily.   Yes [provider]  folic acid (FOLVITE) 1 MG tablet Take 1 mg by mouth daily.   Yes [provider]  HYDROcodone-acetaminophen (NORCO/VICODIN) 5-325 MG tablet Take 1-2 tablets by mouth every 4 (four) hours as needed for severe pain. 12/05/17  Yes Jacalyn Lefevre, MD  hydroxychloroquine (PLAQUENIL) 200 MG tablet Take 200 mg by mouth 2 (two) times daily.   Yes [provider]  ibuprofen (ADVIL,MOTRIN) 600 MG tablet Take 1 tablet (600 mg total) every 8 (eight) hours as needed by mouth for moderate pain. 07/19/17  Yes Shaune Pollack, MD  iron polysaccharides (NIFEREX) 150 MG capsule Take 150 mg by mouth daily.   Yes [provider]  magnesium oxide (MAG-OX) 400 MG tablet Take 400 mg by mouth daily.   Yes [provider]  meclizine (ANTIVERT) 25 MG tablet Take 1 tablet (25 mg total) by mouth 3 (three) times daily as needed for dizziness. 11/10/16  Yes Delo, Riley Lam, MD  methocarbamol (ROBAXIN) 500 MG tablet Take 1 tablet (500 mg total) by mouth 2 (two) times daily. 07/08/18  Yes Dartha Lodge, PA-C  methotrexate (RHEUMATREX) 2.5 MG tablet Take 2.5 mg by mouth once a week. Caution:Chemotherapy. Protect from light.   Yes [provider]  nortriptyline (PAMELOR) 25 MG capsule Take 2 capsules (50 mg total) by mouth at bedtime. 04/20/17  Yes Levert Feinstein, MD  omeprazole (PRILOSEC) 40 MG capsule Take 40 mg by mouth daily.   Yes [provider]  predniSONE (STERAPRED UNI-PAK 21 TAB) 10 MG (21) TBPK tablet As directed 05/24/21  Yes Adelard Sanon K, PA-C  sulfaSALAzine (AZULFIDINE) 500 MG tablet Take 500 mg by mouth 2 (two) times daily.   Yes [provider]  valACYclovir (VALTREX) 500 MG tablet Take 500 mg by mouth daily.   Yes [provider]  verapamil (CALAN-SR) 120 MG CR tablet Take 120 mg by mouth 2 (two) times daily.   Yes [provider]    Family History Family History  Problem Relation Age of Onset   Asthma Mother    Hypertension Mother     Hypertension Father    Stroke Father    Heart attack Father    Thyroid disease Sister    Diabetes Maternal Grandmother    Cancer Paternal Grandmother     Social History Social History   Tobacco Use   Smoking status: Never   Smokeless tobacco: Never  Substance Use Topics   Alcohol use: No    Comment: quit 06/2010   Drug use: No     Allergies   Ondansetron, Pilocarpine, Sumatriptan, and Dhea   Review of Systems Review of Systems  Constitutional:  Positive for activity change, fatigue and fever. Negative for appetite change.  HENT:  Positive for sore throat. Negative for congestion, sinus pressure and sneezing.   Respiratory:  Negative for cough and shortness of breath.   Cardiovascular:  Negative for chest pain.  Gastrointestinal:  Positive for diarrhea. Negative for abdominal pain, nausea and vomiting.  Musculoskeletal:  Positive for arthralgias and myalgias.  Neurological:  Negative for dizziness, light-headedness and headaches.    Physical Exam Triage Vital Signs ED Triage Vitals  Enc Vitals Group     BP 05/24/21 1705 131/84     Pulse Rate 05/24/21 1705 100     Resp --      Temp 05/24/21 1705 (!) 102 F (38.9 C)     Temp Source 05/24/21 1705 Oral     SpO2 05/24/21 1705 94 %     Weight --      Height --      Head Circumference --      Peak Flow --      Pain Score 05/24/21 1707 0     Pain Loc --      Pain Edu? --      Excl. in GC? --    No data found.  Updated Vital Signs BP 131/84 (BP Location: Right Arm)   Pulse 100   Temp 100.2 F (37.9 C) (Oral)   LMP 11/10/2013   SpO2 94%   Visual Acuity Right Eye Distance:   Left Eye Distance:   Bilateral Distance:    Right Eye Near:   Left Eye Near:    Bilateral Near:     Physical Exam Vitals reviewed.  Constitutional:      General: She is awake. She is not in acute distress.    Appearance: Normal appearance. She is normal weight. She is not ill-appearing.     Comments: Very pleasant female appears  in age in no acute distress sitting comfortably in exam room wrapped in a blanket  HENT:     Head: Normocephalic and atraumatic.     Right Ear: Tympanic membrane, ear canal and external ear normal. Tympanic membrane is not erythematous or bulging.     Left Ear: Tympanic membrane, ear canal and external ear normal. Tympanic membrane is not erythematous or bulging.     Nose:  Right Sinus: No maxillary sinus tenderness or frontal sinus tenderness.     Left Sinus: No maxillary sinus tenderness or frontal sinus tenderness.     Mouth/Throat:     Pharynx: Uvula midline. Posterior oropharyngeal erythema present. No oropharyngeal exudate.  Cardiovascular:     Rate and Rhythm: Normal rate and regular rhythm.     Heart sounds: Normal heart sounds, S1 normal and S2 normal. No murmur heard. Pulmonary:     Effort: Pulmonary effort is normal.     Breath sounds: Normal breath sounds. No wheezing, rhonchi or rales.     Comments: Clear to auscultation bilaterally Psychiatric:        Behavior: Behavior is cooperative.     UC Treatments / Results  Labs (all labs ordered are listed, but only abnormal results are displayed) Labs Reviewed  COVID-19, FLU A+B NAA    EKG   Radiology No results found.  Procedures Procedures (including critical care time)  Medications Ordered in UC Medications  acetaminophen (TYLENOL) tablet 1,000 mg (1,000 mg Oral Given 05/24/21 1711)  ibuprofen (ADVIL) tablet 800 mg (800 mg Oral Given 05/24/21 1739)    Initial Impression / Assessment and Plan / UC Course  I have reviewed the triage vital signs and the nursing notes.  Pertinent labs & imaging results that were available during my care of the patient were reviewed by me and considered in my medical decision making (see chart for details).      Symptoms consistent with viral etiology given short duration of symptoms.  Strongly suspect COVID-19 given household sick contacts that have recently tested positive.   COVID-19 and flu testing was obtained today-results pending.  Patient would qualify for oral antivirals given age and immunosuppression medication.  Last creatinine level obtained in care everywhere from at [facility] was 1.1 on 04/01/2021 with calculated creatinine clearance of 70.34 mL/min so repeat metabolic panel was not obtained.  Patient was started on prednisone taper to help manage symptoms with instruction to return to her 5 mg daily dosing when she completes taper.  She was instructed not to take NSAIDs with this medication due to risk of GI bleeding.  She can use Tylenol, Mucinex, Flonase for additional symptom relief.  Recommend she rest and drink plenty of fluid.  Discussed that if she has any worsening symptoms she needs to go to the emergency room.  Encouraged her to follow-up with either our clinic or her primary care provider within a week to ensure symptom improvement.  Strict return precautions given to which she expressed understanding.  Final Clinical Impressions(s) / UC Diagnoses   Final diagnoses:  Upper respiratory tract infection, unspecified type  Fever, unspecified  Body aches  Malaise and fatigue  Immunosuppression due to chronic steroid use [facility])     Discharge Instructions      We will contact you if you are positive for flu or COVID.  Because of your history of prednisone use which impacts your immune system he would qualify for oral antivirals.  If you test positive and are interested in starting these medications please let us know we can consider sending them in.  I have called in a prednisone taper.  Please take this as prescribed and then when you get to the point that you are off the taper return to your 5 mg daily prednisone.  Use Tylenol for additional fever and pain relief.  Avoid NSAIDs including aspirin, ibuprofen/Advil, naproxen/Aleve as this can cause stomach bleeding.  You can use Mucinex  and Flonase for additional symptom relief.  If you have any  worsening symptoms you need to return for reevaluation.     ED Prescriptions     Medication Sig Dispense Auth. Provider   predniSONE (STERAPRED UNI-PAK 21 TAB) 10 MG (21) TBPK tablet As directed 21 tablet Keymora Grillot K, PA-C      PDMP not reviewed this encounter.   Jeani Hawking, PA-C 05/24/21 1840

## 2021-05-24 NOTE — Discharge Instructions (Addendum)
We will contact you if you are positive for flu or COVID.  Because of your history of prednisone use which impacts your immune system he would qualify for oral antivirals.  If you test positive and are interested in starting these medications please let us know we can consider sending them in.  I have called in a prednisone taper.  Please take this as prescribed and then when you get to the point that you are off the taper return to your 5 mg daily prednisone.  Use Tylenol for additional fever and pain relief.  Avoid NSAIDs including aspirin, ibuprofen/Advil, naproxen/Aleve as this can cause stomach bleeding.  You can use Mucinex and Flonase for additional symptom relief.  If you have any worsening symptoms you need to return for reevaluation.

## 2021-05-24 NOTE — ED Triage Notes (Signed)
Patient c/o fever started this morning, body aches, headaches.  Patient's son did test positive for COVID on Tuesday.  Home COVID test was negative.  Patient is vaccinated for COVID.

## 2021-05-25 LAB — COVID-19, FLU A+B NAA
Influenza A, NAA: NOT DETECTED
Influenza B, NAA: NOT DETECTED
SARS-CoV-2, NAA: DETECTED — AB

## 2021-05-26 ENCOUNTER — Telehealth: Payer: Self-pay

## 2021-05-26 MED ORDER — MOLNUPIRAVIR 200 MG PO CAPS: 800.0000 mg | ORAL_CAPSULE | Freq: Two times a day (BID) | ORAL | 0 refills | Status: AC

## 2021-05-26 NOTE — Telephone Encounter (Signed)
Antiviral medication sent to pt preferred pharmacy authorized by provider on site.

## 2022-09-23 DIAGNOSIS — R7303 Prediabetes: Secondary | ICD-10-CM | POA: Insufficient documentation

## 2022-10-07 DIAGNOSIS — G8929 Other chronic pain: Secondary | ICD-10-CM | POA: Insufficient documentation

## 2022-11-21 ENCOUNTER — Emergency Department (HOSPITAL_COMMUNITY)
Admission: EM | Admit: 2022-11-21 | Discharge: 2022-11-22 | Discharge status: Against Medical Advice | Payer: Medicare Other

## 2022-11-21 NOTE — ED Notes (Signed)
Pt stated that she was feeling better and that she did not want to stay

## 2023-05-15 ENCOUNTER — Ambulatory Visit
Admission: EM | Admit: 2023-05-15 | Discharge: 2023-05-15 | Disposition: A | Discharge status: Home / Self Care | Payer: Medicare Other | Source: Home / Self Care

## 2023-05-15 DIAGNOSIS — Z1152 Encounter for screening for COVID-19: Secondary | ICD-10-CM | POA: Diagnosis not present

## 2023-05-15 DIAGNOSIS — R6883 Chills (without fever): Secondary | ICD-10-CM | POA: Insufficient documentation

## 2023-05-15 DIAGNOSIS — J069 Acute upper respiratory infection, unspecified: Secondary | ICD-10-CM | POA: Insufficient documentation

## 2023-05-15 DIAGNOSIS — R1110 Vomiting, unspecified: Secondary | ICD-10-CM | POA: Diagnosis present

## 2023-05-15 LAB — POCT RAPID STREP A (OFFICE): Rapid Strep A Screen: NEGATIVE

## 2023-05-15 LAB — POCT INFLUENZA A/B
Influenza A, POC: NEGATIVE
Influenza B, POC: NEGATIVE

## 2023-05-15 MED ORDER — AMOXICILLIN-POT CLAVULANATE 875-125 MG PO TABS: 1.0000 | ORAL_TABLET | Freq: Two times a day (BID) | ORAL | 0 refills | Status: DC

## 2023-05-15 NOTE — ED Provider Notes (Signed)
[facility]    CSN: [id number] Arrival date & time: 05/15/23  1716      History   Chief Complaint Chief Complaint  Patient presents with   Generalized Body Aches   Emesis    HPI Stephanie Kirk is a 57 y.o. female.   Patient here today for evaluation of sore throat that started 6 days ago.  She states she has also had some chills body aches and sweats develop.  She did have some nausea and vomiting on one day of illness.  She denies significant cough.  She has not had any exposure to COVID that she is aware of.  The history is provided by the patient.  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   Anemia 08/19/2019   Iron deficiency 08/19/2019   History of ventricular tachycardia 10/07/2018   Genital herpes simplex type 2 10/07/2018   Dry eye syndrome of both eyes 03/25/2018   Long-term use of Plaquenil 03/25/2018   OAG (open angle glaucoma) suspect, low risk, bilateral 03/25/2018   Chronic migraine 04/20/2017   Rheumatoid arthritis of multiple sites with negative rheumatoid factor (HCC) 05/02/2016   Dysplasia of cervix, low grade (CIN 1) 04/17/2016   Cervical cancer screening 04/17/2016   Herpes simplex infection of skin 04/17/2016   Recurrent genital HSV (herpes simplex virus) infection 04/17/2016   Screening for HPV (human papillomavirus) 04/17/2016   Trochanteric bursitis of left hip 10/07/2015   Encounter for long-term current use of high risk medication 02/14/2015   Anxiety 07/26/2014   Memory  loss 12/26/2013   Palpitations 12/26/2013   Routine adult health maintenance 12/26/2013   GERD (gastroesophageal reflux disease) 11/26/2013   Mitral valve prolapse 11/26/2013   Rheumatoid arthritis (HCC) 11/26/2013   Sjogren's syndrome with keratoconjunctivitis sicca (HCC) 11/26/2013    History reviewed. No pertinent surgical history.  OB History   No obstetric history on file.      Home Medications    Prior to Admission medications   Medication Sig Start Date End Date Taking? Authorizing Provider  amoxicillin-clavulanate (AUGMENTIN) 875-125 MG tablet Take 1 tablet by mouth every 12 (twelve) hours. 05/15/23  Yes Tomi Bamberger, PA-C  Calcium Carbonate-Vitamin D3 600-400 MG-UNIT TABS Take by mouth. Once daily   Yes [provider]  carboxymethylcellulose (REFRESH PLUS) 0.5 % SOLN 1 drop daily as needed.   Yes [provider]  cycloSPORINE (RESTASIS) 0.05 % ophthalmic emulsion Place 1 drop into both eyes 2 (two) times daily.   Yes [provider]  DULoxetine (CYMBALTA) 30 MG capsule Take 30 mg by mouth daily.   Yes [provider]  folic acid (FOLVITE) 1 MG tablet Take 1 mg by mouth daily.   Yes [provider]  gabapentin (NEURONTIN) 300 MG capsule Take 2 capsules by mouth 2 (two) times daily. 07/12/18  Yes [provider]  hydroxychloroquine (PLAQUENIL) 200 MG tablet Take 200 mg by mouth 2 (two) times daily. Last taken: Tuesday  Yes [provider]  ibuprofen (ADVIL,MOTRIN) 600 MG tablet Take 1 tablet (600 mg total) every 8 (eight) hours as needed by mouth for moderate pain. 07/19/17  Yes Shaune Pollack, MD  magnesium oxide (MAG-OX) 400 MG tablet Take 400 mg by mouth daily.   Yes [provider]  omeprazole (PRILOSEC) 40 MG capsule Take 40 mg by mouth daily.   Yes [provider]  predniSONE (STERAPRED UNI-PAK 21 TAB) 10 MG (21) TBPK tablet As directed 05/24/21  Yes Raspet, Erin K, PA-C  valACYclovir  (VALTREX) 500 MG tablet Take 500 mg by mouth daily.   Yes [provider]  verapamil (CALAN-SR) 120 MG CR tablet Take 120 mg by mouth 2 (two) times daily.   Yes [provider]  ferrous sulfate 325 (65 FE) MG tablet Take 325 mg by mouth daily with breakfast.    [provider]  HYDROcodone-acetaminophen (NORCO/VICODIN) 5-325 MG tablet Take 1-2 tablets by mouth every 4 (four) hours as needed for severe pain. 12/05/17   Jacalyn Lefevre, MD  iron polysaccharides (NIFEREX) 150 MG capsule Take 150 mg by mouth daily.    [provider]  meclizine (ANTIVERT) 25 MG tablet Take 1 tablet (25 mg total) by mouth 3 (three) times daily as needed for dizziness. 11/10/16   Geoffery Lyons, MD  methocarbamol (ROBAXIN) 500 MG tablet Take 1 tablet (500 mg total) by mouth 2 (two) times daily. 07/08/18   Dartha Lodge, PA-C  methotrexate (RHEUMATREX) 2.5 MG tablet Take 2.5 mg by mouth once a week. Caution:Chemotherapy. Protect from light.    [provider]  nortriptyline (PAMELOR) 25 MG capsule Take 2 capsules (50 mg total) by mouth at bedtime. 04/20/17   Levert Feinstein, MD  sulfaSALAzine (AZULFIDINE) 500 MG tablet Take 500 mg by mouth 2 (two) times daily.    [provider]    Family History Family History  Problem Relation Age of Onset   Asthma Mother    Hypertension Mother    Hypertension Father    Stroke Father    Heart attack Father    Thyroid disease Sister    Diabetes Maternal Grandmother    Cancer Paternal Grandmother     Social History Social History   Tobacco Use   Smoking status: Never   Smokeless tobacco: Never  Vaping Use   Vaping status: Never Used  Substance Use Topics   Alcohol use: No    Comment: quit 06/2010   Drug use: No     Allergies   Ondansetron, Sumatriptan, Dhea, and Pilocarpine   Review of Systems Review of Systems  Constitutional:  Positive for chills. Negative for fever.  HENT:  Positive for congestion and sore  throat. Negative for ear pain.   Eyes:  Negative for discharge and redness.  Respiratory:  Negative for cough, shortness of breath and wheezing.   Gastrointestinal:  Positive for nausea and vomiting. Negative for abdominal pain and diarrhea.  Musculoskeletal:  Positive for myalgias.     Physical Exam Triage Vital Signs ED Triage Vitals  Encounter Vitals Group     BP 05/15/23 1731 112/75     Systolic BP Percentile --      Diastolic BP Percentile --      Pulse Rate 05/15/23 1731 92     Resp 05/15/23 1731 18     Temp 05/15/23 1731 98.7 F (37.1 C)     Temp Source 05/15/23 1731 Oral     SpO2 05/15/23 1731 97 %  Weight 05/15/23 1724 169 lb 15.6 oz (77.1 kg)     Height 05/15/23 1724 5\' 6"  (1.676 m)     Head Circumference --      Peak Flow --      Pain Score 05/15/23 1719 0     Pain Loc --      Pain Education --      Exclude from Growth Chart --    No data found.  Updated Vital Signs BP 112/75 (BP Location: Left Arm)   Pulse 92   Temp 98.7 F (37.1 C) (Oral)   Resp 18   Ht 5\' 6"  (1.676 m)   Wt 169 lb 15.6 oz (77.1 kg)   LMP 11/10/2013   SpO2 97%   BMI 27.43 kg/m     Physical Exam Vitals and nursing note reviewed.  Constitutional:      General: She is not in acute distress.    Appearance: Normal appearance. She is not ill-appearing.  HENT:     Head: Normocephalic and atraumatic.     Nose: Congestion present.     Mouth/Throat:     Mouth: Mucous membranes are moist.     Pharynx: Posterior oropharyngeal erythema present. No oropharyngeal exudate.  Eyes:     Conjunctiva/sclera: Conjunctivae normal.  Cardiovascular:     Rate and Rhythm: Normal rate and regular rhythm.     Heart sounds: Normal heart sounds. No murmur heard. Pulmonary:     Effort: Pulmonary effort is normal. No respiratory distress.     Breath sounds: Normal breath sounds. No wheezing, rhonchi or rales.  Skin:    General: Skin is warm and dry.  Neurological:     Mental Status: She is alert.   Psychiatric:        Mood and Affect: Mood normal.        Thought Content: Thought content normal.      UC Treatments / Results  Labs (all labs ordered are listed, but only abnormal results are displayed) Labs Reviewed  SARS CORONAVIRUS 2 (TAT 6-24 HRS)  POCT INFLUENZA A/B  POCT RAPID STREP A (OFFICE)    EKG   Radiology No results found.  Procedures Procedures (including critical care time)  Medications Ordered in UC Medications - No data to display  Initial Impression / Assessment and Plan / UC Course  I have reviewed the triage vital signs and the nursing notes.  Pertinent labs & imaging results that were available during my care of the patient were reviewed by me and considered in my medical decision making (see chart for details).    Rapid strep and flu screening negative.  Will screen for COVID.  Augmentin prescribed given patient's immune modulating medication duration of symptoms.  Encouraged follow-up if no gradual improvement with any further concerns.  Final Clinical Impressions(s) / UC Diagnoses   Final diagnoses:  Chills without fever  Acute upper respiratory infection   Discharge Instructions   None    ED Prescriptions     Medication Sig Dispense Auth. Provider   amoxicillin-clavulanate (AUGMENTIN) 875-125 MG tablet Take 1 tablet by mouth every 12 (twelve) hours. 14 tablet Tomi Bamberger, PA-C      PDMP not reviewed this encounter.   Tomi Bamberger, PA-C 05/16/23 [phone]

## 2023-05-15 NOTE — ED Triage Notes (Signed)
"  Started with sore throat on Sunday around 5-6pm, then chills, body aches, sweats and on Tuesday stomach upset with vomiting". Since then was taking Mucinex "and feeling better" but "fatigue, chills, sweats started again yesterday". No exposure to COVID19 known.

## 2023-05-15 NOTE — ED Triage Notes (Signed)
@   home COVID19 test "Negative". ?

## 2023-05-16 ENCOUNTER — Encounter: Payer: Self-pay | Admitting: Physician Assistant

## 2023-05-16 LAB — SARS CORONAVIRUS 2 (TAT 6-24 HRS): SARS Coronavirus 2: NEGATIVE

## 2023-12-19 LAB — GLUCOSE, POCT (MANUAL RESULT ENTRY): Glucose Fasting, POC: 103 mg/dL — AB (ref 70–99)

## 2023-12-19 NOTE — Progress Notes (Signed)
Declined SDOH.

## 2024-01-21 ENCOUNTER — Emergency Department (HOSPITAL_COMMUNITY)
Admission: EM | Admit: 2024-01-21 | Discharge: 2024-01-21 | Disposition: A | Discharge status: Home / Self Care | Attending: Student | Admitting: Student

## 2024-01-21 ENCOUNTER — Emergency Department (HOSPITAL_COMMUNITY)

## 2024-01-21 ENCOUNTER — Encounter (HOSPITAL_COMMUNITY): Payer: Self-pay

## 2024-01-21 ENCOUNTER — Other Ambulatory Visit: Payer: Self-pay

## 2024-01-21 ENCOUNTER — Ambulatory Visit
Admission: RE | Admit: 2024-01-21 | Discharge: 2024-01-21 | Disposition: A | Discharge status: Home / Self Care | Source: Ambulatory Visit | Attending: Family Medicine | Admitting: Family Medicine

## 2024-01-21 VITALS — BP 115/74 | HR 73 | Temp 98.0°F | Resp 18 | Wt 170.0 lb

## 2024-01-21 DIAGNOSIS — D72829 Elevated white blood cell count, unspecified: Secondary | ICD-10-CM | POA: Insufficient documentation

## 2024-01-21 DIAGNOSIS — I4892 Unspecified atrial flutter: Secondary | ICD-10-CM

## 2024-01-21 DIAGNOSIS — M79602 Pain in left arm: Secondary | ICD-10-CM | POA: Diagnosis not present

## 2024-01-21 DIAGNOSIS — R002 Palpitations: Secondary | ICD-10-CM | POA: Diagnosis not present

## 2024-01-21 LAB — CBC WITH DIFFERENTIAL/PLATELET
Abs Immature Granulocytes: 0.08 10*3/uL — ABNORMAL HIGH (ref 0.00–0.07)
Basophils Absolute: 0 10*3/uL (ref 0.0–0.1)
Basophils Relative: 0 %
Eosinophils Absolute: 0 10*3/uL (ref 0.0–0.5)
Eosinophils Relative: 0 %
HCT: 37.2 % (ref 36.0–46.0)
Hemoglobin: 11.6 g/dL — ABNORMAL LOW (ref 12.0–15.0)
Immature Granulocytes: 1 %
Lymphocytes Relative: 11 %
Lymphs Abs: 1.9 10*3/uL (ref 0.7–4.0)
MCH: 27.3 pg (ref 26.0–34.0)
MCHC: 31.2 g/dL (ref 30.0–36.0)
MCV: 87.5 fL (ref 80.0–100.0)
Monocytes Absolute: 0.9 10*3/uL (ref 0.1–1.0)
Monocytes Relative: 5 %
Neutro Abs: 13.6 10*3/uL — ABNORMAL HIGH (ref 1.7–7.7)
Neutrophils Relative %: 83 %
Platelets: 322 10*3/uL (ref 150–400)
RBC: 4.25 MIL/uL (ref 3.87–5.11)
RDW: 15.8 % — ABNORMAL HIGH (ref 11.5–15.5)
WBC: 16.5 10*3/uL — ABNORMAL HIGH (ref 4.0–10.5)
nRBC: 0 % (ref 0.0–0.2)

## 2024-01-21 LAB — BASIC METABOLIC PANEL WITH GFR
Anion gap: 10 (ref 5–15)
BUN: 18 mg/dL (ref 6–20)
CO2: 24 mmol/L (ref 22–32)
Calcium: 9.3 mg/dL (ref 8.9–10.3)
Chloride: 103 mmol/L (ref 98–111)
Creatinine, Ser: 0.94 mg/dL (ref 0.44–1.00)
GFR, Estimated: >60 mL/min (ref >60)
Glucose, Bld: 104 mg/dL — ABNORMAL HIGH (ref 70–99)
Potassium: 4 mmol/L (ref 3.5–5.1)
Sodium: 137 mmol/L (ref 135–145)

## 2024-01-21 LAB — BRAIN NATRIURETIC PEPTIDE: B Natriuretic Peptide: 39 pg/mL (ref 0.0–100.0)

## 2024-01-21 LAB — TROPONIN I (HIGH SENSITIVITY): Troponin I (High Sensitivity): 6 ng/L (ref <18)

## 2024-01-21 LAB — MAGNESIUM: Magnesium: 2.2 mg/dL (ref 1.7–2.4)

## 2024-01-21 NOTE — ED Provider Notes (Signed)
 [facility]   [id number] 01/21/24 Arrival Time: 1100  ASSESSMENT & PLAN:  1. Atrial flutter, unspecified type (HCC)   2. Left arm pain    Limited resources here for a thorough workup. To ED for further evaluation. Declines EMS. Discharged via POV. Stable upon discharge.  ECG: Performed today and interpreted by me: atrial flutter; no STEMI.  Reviewed expectations re: course of current medical issues. Questions answered. Outlined signs and symptoms indicating need for more acute intervention. Patient verbalized understanding. After Visit Summary given.   SUBJECTIVE:  History from: patient. Stephanie Kirk is a 58 y.o. female who presents with complaint of an "irregular heart beat"; first noted approx 4 d ago; feels daily. H/O mitral valve prolapse; hasn't seen cardiologist in several years. Reports L arm pain that was noted shortly after the irregular heart beat. Denies h/o similar. Denies assoc n/v/SOB/diaphoresis. No new medications. Denies recreational drug use.  OBJECTIVE:  Vitals:   01/21/24 1125 01/21/24 1126  BP:  115/74  Pulse:  73  Resp:  18  Temp:  98 F (36.7 C)  TempSrc:  Oral  SpO2:  97%  Weight: 77.1 kg     General appearance: alert, oriented, no acute distress Eyes: PERRLA; EOMI; conjunctivae normal HENT: normocephalic; atraumatic Neck: supple with FROM Lungs: without labored respirations; speaks full sentences without difficulty; CTAB Heart: regular without obvious gallop; no murmer appreciated Abdomen: soft, non-tender; no guarding or rebound tenderness Extremities: without edema; without calf swelling or tenderness; symmetrical without gross deformities Skin: warm and dry; without rash or lesions Neuro: normal gait Psychological: alert and cooperative; normal mood and affect   Allergies  Allergen Reactions   Ondansetron     Other reaction(s): Other, Other (See Comments)  Headache  Other Reaction(s): Other  Headache  Other  reaction(s): Other, Other (See Comments)  Headache  Headache  Headache  Headache    Other reaction(s): Other, Other (See Comments) Headache Headache Headache    Headache Other reaction(s): Other, Other (See Comments) Headache Headache Headache   Sumatriptan Swelling    Tongue and neck felt tight and swollen  Tongue and neck felt tight and swollen  Tongue and neck felt tight and swollen  Tongue and neck felt tight and swollen  Tongue and neck felt tight and swollen    Tongue and neck felt tight and swollen Tongue and neck felt tight and swollen    Tongue and neck felt tight and swollen Tongue and neck felt tight and swollen Tongue and neck felt tight and swollen   Dhea Other (See Comments) and Rash    Other reaction(s): Other (See Comments)  Ache  Other Reaction(s): Other  Ache  Other reaction(s): Other (See Comments)  Other reaction(s): Other (See Comments)  Ache  Ache  Ache    Other reaction(s): Other (See Comments) Ache Ache    Ache Other reaction(s): Other (See Comments) Other reaction(s): Other (See Comments) Ache Ache   Pilocarpine Rash    Other reaction(s): Headache, Other (See Comments)  Sweating to the point where I had to change clothes  Other Reaction(s): Headache, Other  Sweating to the point where I had to change clothes  Sweating to the point where I had to change clothes  Other reaction(s): Headache, Other (See Comments)  Sweating to the point where I had to change clothes  Sweating to the point where I had to change clothes  Sweating to the point where I had to change clothes    Other reaction(s): Headache, Other (See  Comments) Sweating to the point where I had to change clothes Sweating to the point where I had to change clothes    Sweating to the point where I had to change clothes Sweating to the point where I had to change clothes Other reaction(s): Headache, Other (See Comments) Sweating to the point where I had to change clothes Sweating  to the point where I had to change clothes    Past Medical History:  Diagnosis Date   Arthritis    rheumatoid   Fibromyalgia    Mitral valve prolapse    Sjogren's disease (HCC)    Social History   Socioeconomic History   Marital status: Single    Spouse name: Not on file   Number of children: Not on file   Years of education: Not on file   Highest education level: Not on file  Occupational History   Not on file  Tobacco Use   Smoking status: Never    Passive exposure: Never   Smokeless tobacco: Never  Vaping Use   Vaping status: Never Used  Substance and Sexual Activity   Alcohol use: No    Comment: quit 06/2010   Drug use: No   Sexual activity: Never  Other Topics Concern   Not on file  Social History Narrative   Lives at home with son and 2 grand daughters.  Retired.  HS grad.  3 Children.   Social Drivers of Health   Financial Resource Strain: Medium Risk (01/12/2024)   Received from [facility]   Overall Financial Resource Strain (CARDIA)    Difficulty of Paying Living Expenses: Somewhat hard  Food Insecurity: Food Insecurity Present (01/12/2024)   Received from [facility]   Hunger Vital Sign    Worried About Running Out of Food in the Last Year: Sometimes true    Ran Out of Food in the Last Year: Sometimes true  Transportation Needs: Unmet Transportation Needs (01/12/2024)   Received from [facility] - Transportation    In the past 12 months, has lack of transportation kept you from medical appointments or from getting medications?: Yes    Lack of Transportation (Non-Medical): Yes  Physical Activity: Low Risk  (11/07/2021)   [facility]   PCARE Exercise SDOH    Exercise: Active Lifestyle Only    PCare Exercise SDOH: Not on file    PCare Exercise SDOH: Not on file  Stress: No Stress Concern Present (11/28/2022)   Received from [facility] - Occupational Stress Questionnaire    Feeling of Stress : Only a little  Social Connections: Moderately Integrated (06/25/2020)   Received from [facility], [facility]   Social Connection and Isolation Panel [NHANES]    Frequency of Communication with Friends and Family: More than three times a week    Frequency of Social Gatherings with Friends and Family: Never    Attends Religious Services: More than 4 times per year    Active Member of [employer] or Organizations: Yes    Attends Engineer, structural: More than 4 times per year    Marital Status: Divorced  Catering manager Violence: Not on file   Family History  Problem Relation Age of Onset   Asthma Mother    Hypertension Mother    Hypertension Father    Stroke Father  Heart attack Father    Thyroid disease Sister    Diabetes Maternal Grandmother    Cancer Paternal Grandmother    History reviewed. No pertinent surgical history.    Afton Albright, MD 01/21/24 1200

## 2024-01-21 NOTE — ED Triage Notes (Signed)
 Pt came in via POV d/t feeling like her heart rates was beating harder & more often. Reports that she was having Lt arm pain the radiates to her fingers 7 went to UC & they told her she had A-Flutter & was sent here. Does currently still have 5/10 pain in her Lt arm during triage.

## 2024-01-21 NOTE — ED Triage Notes (Addendum)
 Pt presents with irregular heart beat as well as left arm pain that starts at the shoulder and radiates down her arm. Pt denies dizziness and chest pain. Symptoms onset Sunday.

## 2024-01-21 NOTE — ED Notes (Signed)
 Patient is being discharged from the Urgent Care and sent to the Emergency Department via POV . Per Dr. Barbar Bonus, patient is in need of higher level of care due to abnormal EKG. Patient is aware and verbalizes understanding of plan of care.  Vitals:   01/21/24 1126  BP: 115/74  Pulse: 73  Resp: 18  Temp: 98 F (36.7 C)  SpO2: 97%

## 2024-01-21 NOTE — ED Provider Notes (Signed)
 [place] EMERGENCY DEPARTMENT AT [facility] Provider Note  CSN: [id number] Arrival date & time: 01/21/24 1241  Chief Complaint(s) No chief complaint on file.  HPI Jenniger Liggon is a 58 y.o. female with PMH Sjogren's, rheumatoid arthritis, fibromyalgia, MVP who presents emergency department for evaluation of arm pain and palpitations.  Patient currently follows outpatient with [facility] for rheumatology and cardiology.  States that she felt worsening arm pain over the last few days with associated palpitations.  Went to urgent care who reportedly found a flutter on an ECG and sent the patient to the emergency room for chest pain rule out given arm pain and suspected new arrhythmia.  Here in the emergency room, patient states that pain starts in the shoulder and radiates down into the fingertips.  No associated shortness of breath, diaphoresis, nausea, vomiting or any exertional component to the pain.   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   Anemia 08/19/2019   Iron deficiency 08/19/2019   History of ventricular tachycardia 10/07/2018   Genital herpes simplex type 2 10/07/2018   Dry eye syndrome of both eyes 03/25/2018   Long-term use of Plaquenil 03/25/2018   OAG (open angle glaucoma) suspect, low risk, bilateral 03/25/2018   Chronic migraine 04/20/2017   Rheumatoid arthritis of multiple sites with negative rheumatoid factor (HCC) 05/02/2016   Dysplasia of cervix, low grade (CIN 1) 04/17/2016   Cervical cancer screening 04/17/2016   Herpes simplex infection of skin 04/17/2016   Recurrent genital HSV (herpes simplex virus) infection  04/17/2016   Screening for HPV (human papillomavirus) 04/17/2016   Trochanteric bursitis of left hip 10/07/2015   Encounter for long-term current use of high risk medication 02/14/2015   Anxiety 07/26/2014   Memory loss 12/26/2013   Palpitations 12/26/2013   Routine adult health maintenance 12/26/2013   GERD (gastroesophageal reflux disease) 11/26/2013   Mitral valve prolapse 11/26/2013   Rheumatoid arthritis (HCC) 11/26/2013   Sjogren's syndrome with keratoconjunctivitis sicca (HCC) 11/26/2013   Home Medication(s) Prior to Admission medications   Medication Sig Start Date End Date Taking? Authorizing Provider  amoxicillin -clavulanate (AUGMENTIN ) 875-125 MG tablet Take 1 tablet by mouth every 12 (twelve) hours. 05/15/23   Vernestine Gondola, PA-C  Calcium Carbonate-Vitamin D3 600-400 MG-UNIT TABS Take by mouth. Once daily    [provider]  carboxymethylcellulose (REFRESH PLUS) 0.5 % SOLN 1 drop daily as needed.    [provider]  cycloSPORINE (RESTASIS) 0.05 % ophthalmic emulsion Place 1 drop into both eyes 2 (two) times daily.    [provider]  DULoxetine (CYMBALTA) 30 MG capsule Take 30 mg by mouth daily.    [provider]  ferrous sulfate 325 (65 FE) MG tablet Take 325 mg by mouth daily with breakfast.    [provider]  folic acid (FOLVITE) 1 MG tablet Take 1 mg by mouth daily.    [provider]  gabapentin (NEURONTIN) 300 MG capsule Take 2 capsules by mouth 2 (two) times daily. 07/12/18   [provider]  HYDROcodone -acetaminophen  (NORCO/VICODIN) 5-325 MG tablet Take 1-2 tablets by mouth every 4 (four)  hours as needed for severe pain. 12/05/17   Haviland, Julie, MD  hydroxychloroquine (PLAQUENIL) 200 MG tablet Take 200 mg by mouth 2 (two) times daily. Last taken: Tuesday    [provider]  ibuprofen  (ADVIL ,MOTRIN ) 600 MG tablet Take 1 tablet (600 mg total) every 8 (eight) hours as needed by mouth for moderate  pain. 07/19/17   Loman Risk, MD  iron polysaccharides (NIFEREX) 150 MG capsule Take 150 mg by mouth daily.    [provider]  magnesium oxide (MAG-OX) 400 MG tablet Take 400 mg by mouth daily.    [provider]  meclizine  (ANTIVERT ) 25 MG tablet Take 1 tablet (25 mg total) by mouth 3 (three) times daily as needed for dizziness. 11/10/16   Orvilla Blander, MD  methocarbamol  (ROBAXIN ) 500 MG tablet Take 1 tablet (500 mg total) by mouth 2 (two) times daily. 07/08/18   Kehrli, Kelsey F, PA-C  methotrexate (RHEUMATREX) 2.5 MG tablet Take 2.5 mg by mouth once a week. Caution:Chemotherapy. Protect from light.    [provider]  nortriptyline  (PAMELOR ) 25 MG capsule Take 2 capsules (50 mg total) by mouth at bedtime. 04/20/17   Phebe Brasil, MD  omeprazole (PRILOSEC) 40 MG capsule Take 40 mg by mouth daily.    [provider]  predniSONE  (STERAPRED UNI-PAK 21 TAB) 10 MG (21) TBPK tablet As directed 05/24/21   Raspet, Erin K, PA-C  sulfaSALAzine (AZULFIDINE) 500 MG tablet Take 500 mg by mouth 2 (two) times daily.    [provider]  valACYclovir (VALTREX) 500 MG tablet Take 500 mg by mouth daily.    [provider]  verapamil (CALAN-SR) 120 MG CR tablet Take 120 mg by mouth 2 (two) times daily.    [provider]                                                                                                                                    Past Surgical History History reviewed. No pertinent surgical history. Family History Family History  Problem Relation Age of Onset   Asthma Mother    Hypertension Mother    Hypertension Father    Stroke Father    Heart attack Father    Thyroid disease Sister    Diabetes Maternal Grandmother    Cancer Paternal Grandmother     Social History Social History   Tobacco Use   Smoking status: Never    Passive exposure: Never   Smokeless tobacco: Never  Vaping Use   Vaping status: Never Used   Substance Use Topics   Alcohol use: No    Comment: quit 06/2010   Drug use: No   Allergies Ondansetron, Sumatriptan, Dhea, and Pilocarpine  Review of Systems Review of Systems  Musculoskeletal:  Positive for arthralgias.    Physical Exam Vital Signs  I have reviewed the triage vital signs BP 128/77   Pulse 69   Temp 98 F (  36.7 C) (Oral)   Resp 19   Ht 5\' 5"  (1.651 m)   Wt 87.1 kg   LMP 11/10/2013   SpO2 100%   BMI 31.95 kg/m   Physical Exam Vitals and nursing note reviewed.  Constitutional:      General: She is not in acute distress.    Appearance: She is well-developed.  HENT:     Head: Normocephalic and atraumatic.  Eyes:     Conjunctiva/sclera: Conjunctivae normal.  Cardiovascular:     Rate and Rhythm: Normal rate and regular rhythm.     Heart sounds: No murmur heard. Pulmonary:     Effort: Pulmonary effort is normal. No respiratory distress.     Breath sounds: Normal breath sounds.  Abdominal:     Palpations: Abdomen is soft.     Tenderness: There is no abdominal tenderness.  Musculoskeletal:        General: Tenderness present. No swelling.     Cervical back: Neck supple.  Skin:    General: Skin is warm and dry.     Capillary Refill: Capillary refill takes less than 2 seconds.  Neurological:     Mental Status: She is alert.  Psychiatric:        Mood and Affect: Mood normal.     ED Results and Treatments Labs (all labs ordered are listed, but only abnormal results are displayed) Labs Reviewed  BASIC METABOLIC PANEL WITH GFR - Abnormal; Notable for the following components:      Result Value   Glucose, Bld 104 (*)    All other components within normal limits  CBC WITH DIFFERENTIAL/PLATELET - Abnormal; Notable for the following components:   WBC 16.5 (*)    Hemoglobin 11.6 (*)    RDW 15.8 (*)    Neutro Abs 13.6 (*)    Abs Immature Granulocytes 0.08 (*)    All other components within normal limits  BRAIN NATRIURETIC PEPTIDE  MAGNESIUM   TROPONIN I (HIGH SENSITIVITY)                                                                                                                          Radiology DG Chest 2 View Result Date: 01/21/2024 CLINICAL DATA:  Left arm pain and palpitations since Sunday EXAM: CHEST - 2 VIEW COMPARISON:  X-ray 12/05/2017 FINDINGS: No consolidation, pneumothorax or effusion. No edema. Normal cardiopericardial silhouette. IMPRESSION: No acute cardiopulmonary disease. Electronically Signed   By: Adrianna Horde M.D.   On: 01/21/2024 13:23    Pertinent labs & imaging results that were available during my care of the patient were reviewed by me and considered in my medical decision making (see MDM for details).  Medications Ordered in ED Medications - No data to display  Procedures Procedures  (including critical care time)  Medical Decision Making / ED Course   This patient presents to the ED for concern of arm pain, this involves an extensive number of treatment options, and is a complaint that carries with it a high risk of complications and morbidity.  The differential diagnosis includes cervical radiculopathy, rheumatoid flare, muscle strain, atypical ACS  MDM: Patient seen emergency room evaluation of arm pain and palpitations.  Physical exam with reproducible tenderness along the arm and in the neck but is otherwise unremarkable.  Laboratory evaluation with a leukocytosis to 16.5 which is nonspecific but is likely secondary to her current prednisone  use, high-sensitivity troponin is normal, electrolytes otherwise unremarkable.  Chest x-ray unremarkable.  ECG with normal sinus rhythm.  I personally reviewed the A-flutter ECG in question and this is likely artifact given that the patient has clearly discernible P waves in other leads with no evidence of flutter and flutter  morphology appears to be isolated to lead II.  Patient was on a cardiac monitor here in the emergency department and displayed no evidence of atrial flutter.  Patient does have a previous CT that shows spurring in the C-spine with cervical foraminal compression patient likely experiencing cervical radiculopathy but she will follow-up with her outpatient providers.  Very low suspicion for ACS today and heart score is low.  At this time she does not inpatient criteria for admission and will be discharged outpatient follow-up   Additional history obtained:  -External records from outside source obtained and reviewed including: Chart review including previous notes, labs, imaging, consultation notes   Lab Tests: -I ordered, reviewed, and interpreted labs.   The pertinent results include:   Labs Reviewed  BASIC METABOLIC PANEL WITH GFR - Abnormal; Notable for the following components:      Result Value   Glucose, Bld 104 (*)    All other components within normal limits  CBC WITH DIFFERENTIAL/PLATELET - Abnormal; Notable for the following components:   WBC 16.5 (*)    Hemoglobin 11.6 (*)    RDW 15.8 (*)    Neutro Abs 13.6 (*)    Abs Immature Granulocytes 0.08 (*)    All other components within normal limits  BRAIN NATRIURETIC PEPTIDE  MAGNESIUM  TROPONIN I (HIGH SENSITIVITY)      EKG   EKG Interpretation Date/Time:  Thursday Jan 21 2024 12:53:08 EDT Ventricular Rate:  74 PR Interval:  158 QRS Duration:  76 QT Interval:  390 QTC Calculation: 432 R Axis:   12  Text Interpretation: Normal sinus rhythm Normal ECG When compared with ECG of 21-Jan-2024 11:40, PREVIOUS ECG IS PRESENT Confirmed by Trenika Hudson ([id number]) on 01/21/2024 1:03:28 PM         Imaging Studies ordered: I ordered imaging studies including chest x-ray I independently visualized and interpreted imaging. I agree with the radiologist interpretation   Medicines ordered and prescription drug management: No orders  of the defined types were placed in this encounter.   -I have reviewed the patients home medicines and have made adjustments as needed  Critical interventions none    Cardiac Monitoring: The patient was maintained on a cardiac monitor.  I personally viewed and interpreted the cardiac monitored which showed an underlying rhythm of: NSR  Social Determinants of Health:  Factors impacting patients care include: none   Reevaluation: After the interventions noted above, I reevaluated the patient and found that they have :improved  Co morbidities that complicate the patient evaluation  Past Medical  History:  Diagnosis Date   Arthritis    rheumatoid   Fibromyalgia    Mitral valve prolapse    Sjogren's disease (HCC)       Dispostion: I considered admission for this patient, but at this time she does not meet inpatient criteria for admission will be discharged with outpatient follow-up     Final Clinical Impression(s) / ED Diagnoses Final diagnoses:  Pain of left upper extremity     @PCDICTATION @    Karlyn Overman, MD 01/21/24 1450

## 2024-01-21 NOTE — ED Provider Triage Note (Signed)
 Emergency Medicine Provider Triage Evaluation Note  Stephanie Kirk , a 58 y.o. female  was evaluated in triage.  Pt complains of left arm pain, and palpitations since Sunday.  No prior history of atrial fibs or atrial flutter.  Does have history of mitral valve prolapse.  Is on verapamil and has not missed any recent doses..  Review of Systems  Positive: As above Negative: As above  Physical Exam  BP (!) 148/98 (BP Location: Right Arm)   Pulse 76   Temp 98 F (36.7 C) (Oral)   Resp 18   Ht 5\' 5"  (1.651 m)   Wt 87.1 kg   LMP 11/10/2013   SpO2 97%   BMI 31.95 kg/m  Gen:   Awake, no distress   Resp:  Normal effort  MSK:   Moves extremities without difficulty  Other:    Medical Decision Making  Medically screening exam initiated at 12:50 PM.  Appropriate orders placed.  Stephanie Kirk was informed that the remainder of the evaluation will be completed by another provider, this initial triage assessment does not replace that evaluation, and the importance of remaining in the ED until their evaluation is complete.    Lucina Sabal, PA-C 01/21/24 1250

## 2024-01-25 NOTE — Progress Notes (Unsigned)
 The patient attended a screening event on 12/19/2023 where her B/P screening results was 120/77, Blood Glucose was 103 fasting. At the event the patient noted she has Medicare for insurance. Pt does not smoke and she does not live with someone who smokes . Patient declined any SDOH insecurities. Pt list PCP as Dr.Hemming. Per chart review pt has a PCP and the last office visit was 01/12/2024 for annual wellness/leg pain. The pt B/P was 126/71 on 01/12/2024. According to chart pt is being treated for greater trochanteric bursitis of left hip. Chart review indicates pt documented food, housing, transportation, and utilities for SDOH needs at a recent appt on 01/22/2024 at [facility]. Chart review also indicates pt has a future appt on 02/02/2024 with PCP. No additional Health equity team support indicated at this time.

## 2024-02-12 DIAGNOSIS — R0602 Shortness of breath: Secondary | ICD-10-CM | POA: Insufficient documentation

## 2024-04-21 ENCOUNTER — Ambulatory Visit
Admission: RE | Admit: 2024-04-21 | Discharge: 2024-04-21 | Disposition: A | Discharge status: Home / Self Care | Source: Ambulatory Visit

## 2024-04-21 VITALS — BP 136/80 | HR 85 | Temp 98.1°F | Resp 18

## 2024-04-21 DIAGNOSIS — J029 Acute pharyngitis, unspecified: Secondary | ICD-10-CM | POA: Diagnosis not present

## 2024-04-21 DIAGNOSIS — J069 Acute upper respiratory infection, unspecified: Secondary | ICD-10-CM

## 2024-04-21 LAB — POC SOFIA SARS ANTIGEN FIA: SARS Coronavirus 2 Ag: NEGATIVE

## 2024-04-21 NOTE — ED Triage Notes (Signed)
 Pt reports sore throat, nasal congestion, productive cough, fever, and chills x2 days. Max temp: 100.4 (8/6). Using OTC cold/flu meds with no relief.

## 2024-04-21 NOTE — ED Provider Notes (Signed)
 [facility]    CSN: [id number] Arrival date & time: 04/21/24  1838      History   Chief Complaint Chief Complaint  Patient presents with   Sore Throat    Want a Covid test. - Entered by patient   Cough   Fever   Chills   Nasal Congestion    HPI Stephanie Kirk is a 58 y.o. female.   Patient here today for evaluation of sore throat nasal congestion and cough.  She reports that symptoms started 3 days ago.  She has not had fever since that time.  She reports that she has had some diarrhea but no nausea or vomiting.  She has tried over-the-counter cold and flu medication without relief.  The history is provided by the patient.  Sore Throat Pertinent negatives include no abdominal pain and no shortness of breath.  Cough Associated symptoms: fever and sore throat   Associated symptoms: no chills, no ear pain, no eye discharge, no shortness of breath and no wheezing   Fever Associated symptoms: congestion, cough and sore throat   Associated symptoms: no chills, no diarrhea, no ear pain, no nausea and no vomiting     Past Medical History:  Diagnosis Date   Arthritis    rheumatoid   Fibromyalgia    Mitral valve prolapse    Sjogren's disease (HCC)     Patient Active Problem List   Diagnosis Date Noted   Shortness of breath 02/12/2024   Chronic pain of both hips 10/07/2022   Prediabetes 09/23/2022   Neck pain 04/11/2020   Myofascial pain 04/11/2020   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   Anemia 08/19/2019   Iron deficiency 08/19/2019   History of ventricular tachycardia 10/07/2018   Genital herpes simplex type 2 10/07/2018   Dry eye syndrome of both eyes 03/25/2018   Long-term use of Plaquenil 03/25/2018   OAG (open angle glaucoma) suspect, low risk, bilateral 03/25/2018   Chronic migraine 04/20/2017   Rheumatoid arthritis of multiple sites with negative rheumatoid factor (HCC) 05/02/2016   Dysplasia of cervix, low grade (CIN 1) 04/17/2016    Cervical cancer screening 04/17/2016   Herpes simplex infection of skin 04/17/2016   Recurrent genital HSV (herpes simplex virus) infection 04/17/2016   Screening for HPV (human papillomavirus) 04/17/2016   Trochanteric bursitis of left hip 10/07/2015   Encounter for long-term (current) use of high-risk medication 02/14/2015   Anxiety 07/26/2014   Memory loss 12/26/2013   Palpitations 12/26/2013   Routine adult health maintenance 12/26/2013   GERD (gastroesophageal reflux disease) 11/26/2013   Mitral valve prolapse 11/26/2013   Rheumatoid arthritis (HCC) 11/26/2013   Sjogren's syndrome with keratoconjunctivitis sicca (HCC) 11/26/2013    History reviewed. No pertinent surgical history.  OB History   No obstetric history on file.      Home Medications    Prior to Admission medications   Medication Sig Start Date End Date Taking? Authorizing Provider  Carboxymeth-Glyc-Polysorb PF (REFRESH OPTIVE MEGA-3) 0.5-1-0.5 % SOLN Apply to eye.   Yes [provider]  carboxymethylcellulose (REFRESH PLUS) 0.5 % SOLN 1 drop daily as needed.   Yes [provider]  Cholecalciferol 50 MCG (2000 UT) TABS Take 2,000 Units by mouth.   Yes [provider]  cycloSPORINE (RESTASIS) 0.05 % ophthalmic emulsion Place 1 drop into both eyes 2 (two) times daily.   Yes [provider]  DULoxetine (CYMBALTA) 30 MG capsule Take 30 mg by mouth daily.   Yes [provider]  gabapentin (NEURONTIN) 300 MG capsule Take 2 capsules by mouth 2 (two) times daily. 07/12/18  Yes [provider]  hydroxychloroquine (PLAQUENIL) 200 MG tablet Take 200 mg by mouth 2 (two) times daily. Last taken: Tuesday   Yes [provider]  INSULIN SYRINGE 1CC/29G (EXEL COMFORT POINT INSULIN SYR) 29G X 1/2 1 ML MISC Draw 20mg . (0.30mLs) subcu once a week. 11/18/18  Yes [provider]  magnesium oxide (MAG-OX) 400 MG tablet Take 400 mg by mouth daily.   Yes [provider]  metoprolol succinate (TOPROL-XL) 25 MG 24 hr tablet Take 12.5 mg by mouth. 03/23/24  Yes [provider]  Misc. Devices (ROLLATOR ULTRA-LIGHT) MISC 1 each by Other route. 08/28/20  Yes [provider]  predniSONE  (DELTASONE ) 5 MG tablet Take 5 mg by mouth daily.   Yes [provider]  RABEprazole (ACIPHEX) 20 MG tablet Take 20 mg by mouth daily.   Yes [provider]  sarilumab (KEVZARA) 200 MG/1. SOSY  10/27/23  Yes [provider]  valACYclovir (VALTREX) 500 MG tablet Take 500 mg by mouth daily.   Yes [provider]  acetaminophen  (TYLENOL ) 500 MG tablet Take 500 mg by mouth.    [provider]  amoxicillin  (AMOXIL ) 875 MG tablet TK 1 T PO  BID Patient not taking: Reported on 04/21/2024 04/14/16   [provider]  amoxicillin -clavulanate (AUGMENTIN ) 875-125 MG tablet Take 1 tablet by mouth every 12 (twelve) hours. Patient not taking: Reported on 04/21/2024 05/15/23   Billy Asberry FALCON, PA-C  antiseptic oral rinse (BIOTENE) LIQD Take by mouth. Patient not taking: Reported on 04/21/2024 04/17/16   [provider]  antiseptic oral rinse (BIOTENE) LIQD Take by mouth. Patient not taking: Reported on 04/21/2024 04/17/16   [provider]  Artificial Saliva (BIOTENE DRY MOUTH MOIST SPRAY) SOLN Indications: dry mouth. Use as directed as needed Patient not taking: Reported on 04/21/2024    [provider]  baricitinib (OLUMIANT) tablet Take 2 mg by mouth. Patient not taking: Reported on 04/21/2024 03/10/18   [provider]  benzonatate (TESSALON) 100 MG capsule     [provider]  Calcium Carb-Cholecalciferol 600-5 MG-MCG TABS Take by mouth. Patient not taking: Reported on 04/21/2024 04/17/16   [provider]  Calcium Carbonate-Vitamin D3 600-400 MG-UNIT TABS Take by mouth. Once daily Patient not taking: Reported on 04/21/2024    [provider]  Cholecalciferol 125 MCG  (5000 UT) TABS Take one every day Indications: low vitamin D  levels Patient not taking: Reported on 04/21/2024    [provider]  Dentifrices (DENTRIFICES) PSTE Place onto teeth. Patient not taking: Reported on 04/21/2024    [provider]  DHEA 25 MG tablet  07/13/18   [provider]  diclofenac Sodium (VOLTAREN) 1 % GEL Apply 2 grams (2 pea sized amounts) to affected area 3-4 times daily as needed for 30 days Patient not taking: Reported on 04/21/2024 02/20/16   [provider]  ferrous sulfate 325 (65 FE) MG tablet Take 325 mg by mouth daily with breakfast. Patient not taking: Reported on 04/21/2024    [provider]  folic acid (FOLVITE) 1 MG tablet Take 1 mg by mouth daily. Patient not taking: Reported on 04/21/2024    [provider]  HYDROcodone -acetaminophen  (NORCO/VICODIN) 5-325 MG tablet Take 1-2 tablets by mouth every 4 (four) hours as needed for severe pain. Patient not taking: Reported on 04/21/2024 12/05/17   Dean Clarity, MD  ibuprofen  (ADVIL ,MOTRIN ) 600 MG tablet Take 1 tablet (600 mg total) every 8 (eight) hours as needed by mouth for moderate pain. 07/19/17   Angelena Smalls, MD  INS SYRINGE/NEEDLE 1CC/28G (PRECISION SURE-DOSE 1CC/28G) 28G X 1/2 1 ML MISC Use one syringe weekly for methotrexate injection Patient not taking: Reported on 04/21/2024 03/16/18   [provider]  INSULIN SYRINGE 1CC/29G (EXEL COMFORT POINT INSULIN SYR) 29G X 1/2 1 ML MISC Draw 22.5 mg (90 units) and inject SQ once weekly Patient not taking: Reported on 04/21/2024 02/15/14   [provider]  iron polysaccharides (NIFEREX) 150 MG capsule Take 150 mg by mouth daily. Patient not taking: Reported on 04/21/2024    [provider]  meclizine  (ANTIVERT ) 25 MG tablet Take 1 tablet (25 mg total) by mouth 3 (three) times daily as needed for dizziness. Patient not taking: Reported on 04/21/2024 11/10/16   Geroldine Berg, MD  methocarbamol  (ROBAXIN )  500 MG tablet Take 1 tablet (500 mg total) by mouth 2 (two) times daily. Patient not taking: Reported on 04/21/2024 07/08/18   Kehrli, Kelsey F, PA-C  methotrexate (RHEUMATREX) 2.5 MG tablet Take 2.5 mg by mouth once a week. Caution:Chemotherapy. Protect from light. Patient not taking: Reported on 04/21/2024    [provider]  methotrexate 50 MG/2ML injection Inject 22.5 mg into the skin. Patient not taking: Reported on 04/21/2024 03/16/18   [provider]  Methotrexate Sodium (METHOTREXATE, PF,) 50 MG/2ML injection Inject into the skin. Patient not taking: Reported on 04/21/2024 12/18/15   [provider]  neomycin-polymyxin b-dexamethasone  (MAXITROL) 3.5-10000-0.1 OINT APPLY 1 INCH INTO LOWER EYELIDS OF OS  BEFORE BEDTIME. Patient not taking: Reported on 04/21/2024 11/27/17   [provider]  nitrofurantoin, macrocrystal-monohydrate, (MACROBID) 100 MG capsule Take 100 mg by mouth 2 (two) times daily. Patient not taking: Reported on 04/21/2024 11/13/23   [provider]  nortriptyline  (PAMELOR ) 25 MG capsule Take 2 capsules (50 mg total) by mouth at bedtime. Patient not taking: Reported on 04/21/2024 04/20/17   Onita Duos, MD  omeprazole (PRILOSEC) 40 MG capsule Take 40 mg by mouth daily. Patient not taking: Reported on 04/21/2024    [provider]  Polyethyl Glycol-Propyl Glycol 0.4-0.3 % SOLN 1 drop. Patient not taking: Reported on 04/21/2024    [provider]  Polyvinyl Alcohol-Povidone PF 1.4-0.6 % SOLN 1-2 drops as needed. Patient not taking: Reported on 04/21/2024 04/17/16   [provider]  predniSONE  (DELTASONE ) 2.5 MG tablet Take 7.5 mg daily Patient not taking: Reported on 04/21/2024 01/23/16   [provider]  predniSONE  (STERAPRED UNI-PAK 21 TAB) 10 MG (21) TBPK tablet As directed Patient not taking: Reported on 04/21/2024 05/24/21   Raspet, Erin K, PA-C  sulfaSALAzine (AZULFIDINE) 500 MG tablet Take 500 mg by mouth 2 (two) times  daily. Patient not taking: Reported on 04/21/2024    [provider]  tapentadol (NUCYNTA) 50 MG tablet TK 1 T PO Q 12 H Patient not taking: Reported on 04/21/2024 12/17/15   [provider]  topiramate (TOPAMAX) 25 MG tablet Take 50 mg by mouth. Patient not taking: Reported on 04/21/2024 09/17/20   [provider]  Ubrogepant (UBRELVY) 50 MG TABS Take 50 mg by mouth. Patient not taking: Reported on 04/21/2024    [provider]  verapamil (CALAN-SR) 120 MG CR tablet Take 120 mg by mouth 2 (two) times daily. Patient not taking: Reported on 04/21/2024    [provider]    Family History Family History  Problem Relation Age of Onset   Asthma Mother    Hypertension Mother    Hypertension Father    Stroke Father    Heart attack Father    Thyroid disease Sister    Diabetes Maternal Grandmother    Cancer Paternal Grandmother     Social History Social History   Tobacco Use   Smoking status: Never    Passive exposure: Never   Smokeless tobacco: Never  Vaping Use   Vaping status: Never Used  Substance Use Topics   Alcohol use: No    Comment: quit 06/2010   Drug use: No     Allergies   Ondansetron, Sumatriptan, Dhea, and Pilocarpine   Review of Systems Review of Systems  Constitutional:  Positive for fever. Negative for chills.  HENT:  Positive for congestion and sore throat. Negative for ear pain.   Eyes:  Negative for discharge and redness.  Respiratory:  Positive for cough. Negative for shortness of breath and wheezing.   Gastrointestinal:  Negative for abdominal pain, diarrhea, nausea and vomiting.     Physical Exam Triage Vital Signs ED Triage Vitals [04/21/24 1854]  Encounter Vitals Group     BP 136/80     Girls Systolic BP Percentile      Girls Diastolic BP Percentile      Boys Systolic BP Percentile      Boys Diastolic BP Percentile      Pulse Rate 85     Resp 18     Temp 98.1 F (36.7 C)     Temp Source Oral     SpO2  96 %     Weight      Height      Head Circumference      Peak Flow      Pain Score 0     Pain Loc      Pain Education      Exclude from Growth Chart    No data found.  Updated Vital Signs BP 136/80 (BP Location: Right Arm)   Pulse 85   Temp 98.1 F (36.7 C) (Oral)   Resp 18   LMP 11/10/2013   SpO2 96%   Visual Acuity Right Eye Distance:   Left Eye Distance:   Bilateral Distance:    Right Eye Near:   Left Eye Near:    Bilateral Near:     Physical Exam Vitals and nursing note reviewed.  Constitutional:      General: She is not in acute distress.    Appearance: Normal appearance. She is not ill-appearing.  HENT:     Head: Normocephalic and atraumatic.     Nose: Congestion present.     Mouth/Throat:     Mouth: Mucous membranes are moist.     Pharynx: Posterior oropharyngeal erythema present. No oropharyngeal exudate.  Eyes:     Conjunctiva/sclera: Conjunctivae normal.  Cardiovascular:     Rate and Rhythm: Normal rate and regular rhythm.     Heart sounds: Normal heart sounds. No murmur heard. Pulmonary:     Effort: Pulmonary effort is normal. No respiratory distress.     Breath sounds: Normal breath sounds. No wheezing, rhonchi or rales.  Skin:    General: Skin is warm and dry.  Neurological:     Mental Status: She is alert.  Psychiatric:        Mood and Affect: Mood normal.        Thought Content: Thought content normal.      UC Treatments /  Results  Labs (all labs ordered are listed, but only abnormal results are displayed) Labs Reviewed  POC SOFIA SARS ANTIGEN FIA - Normal    EKG   Radiology No results found.  Procedures Procedures (including critical care time)  Medications Ordered in UC Medications - No data to display  Initial Impression / Assessment and Plan / UC Course  I have reviewed the triage vital signs and the nursing notes.  Pertinent labs & imaging results that were available during my care of the patient were reviewed by me  and considered in my medical decision making (see chart for details).    COVID screening negative in office.  Suspect likely viral etiology of symptoms and recommended symptomatic treatment, increase fluids and rest with follow-up if no gradual improvement with any further concerns.  Final Clinical Impressions(s) / UC Diagnoses   Final diagnoses:  Acute pharyngitis, unspecified etiology  Acute upper respiratory infection   Discharge Instructions   None    ED Prescriptions   None    PDMP not reviewed this encounter.   Billy Asberry FALCON, PA-C 04/21/24 1931

## 2024-04-24 ENCOUNTER — Ambulatory Visit: Payer: Self-pay

## 2024-04-24 ENCOUNTER — Emergency Department (HOSPITAL_COMMUNITY)

## 2024-04-24 ENCOUNTER — Encounter (HOSPITAL_COMMUNITY): Payer: Self-pay

## 2024-04-24 ENCOUNTER — Other Ambulatory Visit: Payer: Self-pay

## 2024-04-24 ENCOUNTER — Emergency Department (HOSPITAL_COMMUNITY)
Admission: EM | Admit: 2024-04-24 | Discharge: 2024-04-24 | Disposition: A | Discharge status: Home / Self Care | Attending: Emergency Medicine | Admitting: Emergency Medicine

## 2024-04-24 DIAGNOSIS — R059 Cough, unspecified: Secondary | ICD-10-CM | POA: Diagnosis present

## 2024-04-24 DIAGNOSIS — J069 Acute upper respiratory infection, unspecified: Secondary | ICD-10-CM | POA: Diagnosis not present

## 2024-04-24 DIAGNOSIS — R051 Acute cough: Secondary | ICD-10-CM

## 2024-04-24 LAB — CBG MONITORING, ED: Glucose-Capillary: 96 mg/dL (ref 70–99)

## 2024-04-24 MED ORDER — AMOXICILLIN-POT CLAVULANATE 875-125 MG PO TABS: 1.0000 | ORAL_TABLET | Freq: Two times a day (BID) | ORAL | 0 refills | Status: AC

## 2024-04-24 MED ORDER — BENZONATATE 100 MG PO CAPS
100.0000 mg | ORAL_CAPSULE | Freq: Three times a day (TID) | ORAL | 0 refills | Status: AC
Start: 2024-04-24 — End: ?

## 2024-04-24 NOTE — ED Notes (Signed)
 Patient transported to X-ray

## 2024-04-24 NOTE — ED Provider Notes (Signed)
  EMERGENCY DEPARTMENT AT [facility] Provider Note   CSN: [id number] Arrival date & time: 04/24/24  9188     Patient presents with: Sore Throat and Cough   Stephanie Kirk is a 58 y.o. female.   Patient presents to the emergency department today for evaluation of ongoing cough and sore throat.  Today is day 5 of illness.  She was seen at urgent care 2 days ago, tested negative for COVID.  She does report a recent sick contact.  Sore throat is her primary concern with ongoing cough.  No ear pain or fevers.  No shortness of breath or wheezing.  No vomiting or diarrhea.  She has been using over-the-counter medications without improvement.       Prior to Admission medications   Medication Sig Start Date End Date Taking? Authorizing Provider  amoxicillin -clavulanate (AUGMENTIN ) 875-125 MG tablet Take 1 tablet by mouth every 12 (twelve) hours. 04/24/24  Yes Jaliana Medellin, PA-C  benzonatate  (TESSALON ) 100 MG capsule Take 1 capsule (100 mg total) by mouth every 8 (eight) hours. 04/24/24  Yes Desiderio Chew, PA-C  acetaminophen  (TYLENOL ) 500 MG tablet Take 500 mg by mouth.    [provider]  Carboxymeth-Glyc-Polysorb PF (REFRESH OPTIVE MEGA-3) 0.5-1-0.5 % SOLN Apply to eye.    [provider]  carboxymethylcellulose (REFRESH PLUS) 0.5 % SOLN 1 drop daily as needed.    [provider]  cycloSPORINE (RESTASIS) 0.05 % ophthalmic emulsion Place 1 drop into both eyes 2 (two) times daily.    [provider]  DULoxetine (CYMBALTA) 30 MG capsule Take 30 mg by mouth daily.    [provider]  gabapentin (NEURONTIN) 300 MG capsule Take 2 capsules by mouth 2 (two) times daily. 07/12/18   [provider]  hydroxychloroquine (PLAQUENIL) 200 MG tablet Take 200 mg by mouth 2 (two) times daily. Last taken: Tuesday    [provider]  ibuprofen  (ADVIL ,MOTRIN ) 600 MG tablet Take 1 tablet (600 mg total) every 8 (eight) hours as needed  by mouth for moderate pain. 07/19/17   Angelena Smalls, MD  INSULIN SYRINGE 1CC/29G (EXEL COMFORT POINT INSULIN SYR) 29G X 1/2 1 ML MISC Draw 20mg . (0.46mLs) subcu once a week. 11/18/18   [provider]  magnesium oxide (MAG-OX) 400 MG tablet Take 400 mg by mouth daily.    [provider]  metoprolol succinate (TOPROL-XL) 25 MG 24 hr tablet Take 12.5 mg by mouth. 03/23/24   [provider]  Misc. Devices (ROLLATOR ULTRA-LIGHT) MISC 1 each by Other route. 08/28/20   [provider]  predniSONE  (DELTASONE ) 5 MG tablet Take 5 mg by mouth daily.    [provider]  RABEprazole (ACIPHEX) 20 MG tablet Take 20 mg by mouth daily.    [provider]  sarilumab (KEVZARA) 200 MG/1. SOSY  10/27/23   [provider]  valACYclovir (VALTREX) 500 MG tablet Take 500 mg by mouth daily.    [provider]    Allergies: Ondansetron, Sumatriptan, Dhea, and Pilocarpine    Review of Systems  Updated Vital Signs BP 137/87   Pulse 71   Temp 98.1 F (36.7 C) (Oral)   Resp 17   Ht 5' 5 (1.651 m)   Wt 87.1 kg   LMP 11/10/2013   SpO2 100%   BMI 31.95 kg/m   Physical Exam Vitals and nursing note reviewed.  Constitutional:      Appearance: She is well-developed.  HENT:     Head: Normocephalic  and atraumatic.     Jaw: No trismus.     Right Ear: Tympanic membrane, ear canal and external ear normal.     Left Ear: Tympanic membrane, ear canal and external ear normal.     Nose: Nose normal. No mucosal edema or rhinorrhea.     Mouth/Throat:     Mouth: Mucous membranes are moist. Mucous membranes are not dry. No oral lesions.     Pharynx: Uvula midline. Posterior oropharyngeal erythema present. No oropharyngeal exudate or uvula swelling.     Tonsils: No tonsillar abscesses.     Comments: No exudate Eyes:     General:        Right eye: No discharge.        Left eye: No discharge.     Conjunctiva/sclera: Conjunctivae normal.   Cardiovascular:     Rate and Rhythm: Normal rate and regular rhythm.     Heart sounds: Normal heart sounds.  Pulmonary:     Effort: Pulmonary effort is normal. No respiratory distress.     Breath sounds: Normal breath sounds. No wheezing or rales.     Comments: Lung sounds clear, unlabored.  Frequent coughing during exam. Abdominal:     Palpations: Abdomen is soft.     Tenderness: There is no abdominal tenderness.  Musculoskeletal:     Cervical back: Normal range of motion and neck supple.  Lymphadenopathy:     Cervical: No cervical adenopathy.  Skin:    General: Skin is warm and dry.  Neurological:     Mental Status: She is alert.  Psychiatric:        Mood and Affect: Mood normal.     (all labs ordered are listed, but only abnormal results are displayed) Labs Reviewed  CBG MONITORING, ED    EKG: None  Radiology: DG Chest 2 View Result Date: 04/24/2024 EXAM: 2 VIEW(S) XRAY OF THE CHEST 04/24/2024 09:31:00 AM COMPARISON: 2 view chest x-ray of 01/21/2024. CLINICAL HISTORY: Cough x 6 days, sore throat. FINDINGS: LUNGS AND PLEURA: No focal pulmonary opacity. No pulmonary edema. No pleural effusion. No pneumothorax. HEART AND MEDIASTINUM: No acute abnormality of the cardiac and mediastinal silhouettes. BONES AND SOFT TISSUES: No acute osseous abnormality. IMPRESSION: 1. No acute process. Electronically signed by: Lonni Necessary MD 04/24/2024 09:52 AM EDT RP Workstation: [id number]     Procedures   Medications Ordered in the ED - No data to display  ED Course  Patient seen and examined. History obtained directly from patient.   Labs/EKG: Ordered CBG.  Imaging: Ordered chest x-ray.  Medications/Fluids: None ordered  Most recent vital signs reviewed and are as follows: BP 137/87   Pulse 71   Temp 98.1 F (36.7 C) (Oral)   Resp 17   Ht 5' 5 (1.651 m)   Wt 87.1 kg   LMP 11/10/2013   SpO2 100%   BMI 31.95 kg/m   Initial impression: Upper respiratory tract  infection  10:10 AM Reassessment performed. Patient appears stable, exam unchanged.  Labs personally reviewed and interpreted including: CBG was normal  Imaging personally visualized and interpreted including: Chest x-ray agree clear without signs of pneumonia  Reviewed pertinent lab work and imaging with patient at bedside. Questions answered.   Most current vital signs reviewed and are as follows: BP 137/87   Pulse 71   Temp 98.1 F (36.7 C) (Oral)   Resp 17   Ht 5' 5 (1.651 m)   Wt 87.1 kg   LMP 11/10/2013   SpO2 100%  BMI 31.95 kg/m   Plan: Discharge to home.   Prescriptions written for: Augmentin , Tessalon   Other home care instructions discussed: OTC meds, rest/hydration  ED return instructions discussed: New or worsening symptoms  Follow-up instructions discussed: Patient encouraged to follow-up with their PCP in 5 days if not improving.  I did discuss antibiotic use with the patient.  Discussed that she can fill this now and trial, although would not expect symptomatic improvement from this if her symptoms are viral.  Discussed that she may also wait a few more days to see if her symptoms spontaneously improved.  If they do, she does not need to fill the antibiotic.                                    Medical Decision Making Amount and/or Complexity of Data Reviewed Radiology: ordered.  Risk Prescription drug management.   Patient with sore throat and cough, no improvement, started 6 days ago.  No fever.  Chest x-ray is clear without signs of pneumonia.  Low clinical concern for strep.  Recent negative COVID test.  Even if this were a false negative, she would be outside of the window for any sort of treatment.  Would continue treatment plan symptomatically, given prescription for Augmentin  if desired for treatment of bacterial bronchitis.  The patient's vital signs, pertinent lab work and imaging were reviewed and interpreted as discussed in the ED course.  Hospitalization was considered for further testing, treatments, or serial exams/observation. However as patient is well-appearing, has a stable exam, and reassuring studies today, I do not feel that they warrant admission at this time. This plan was discussed with the patient who verbalizes agreement and comfort with this plan and seems reliable and able to return to the Emergency Department with worsening or changing symptoms.        Final diagnoses:  Upper respiratory tract infection, unspecified type  Acute cough    ED Discharge Orders          Ordered    benzonatate  (TESSALON ) 100 MG capsule  Every 8 hours        04/24/24 0958    amoxicillin -clavulanate (AUGMENTIN ) 875-125 MG tablet  Every 12 hours        04/24/24 0958               Desiderio Chew, PA-C 04/24/24 1012    Freddi Hamilton, MD 04/25/24 [phone]

## 2024-04-24 NOTE — ED Triage Notes (Signed)
 Pt c.o sore throat, coughing and sneezing. Pt seen at UC 3 days ago and was told it was a respiratory virus. Pt taking OTC meds without relief.

## 2024-04-24 NOTE — Discharge Instructions (Signed)
 Please read and follow all provided instructions.  Your diagnoses today include:  1. Upper respiratory tract infection, unspecified type   2. Acute cough    Tests performed today include: Vital signs. See below for your results today.   Medications prescribed:  Tessalon  Perles - cough suppressant medication  Augmentin  - antibiotic  You have been prescribed an antibiotic medicine: take the entire course of medicine even if you are feeling better. Stopping early can cause the antibiotic not to work.  Take any prescribed medications only as directed. Treatment for your infection is aimed at treating the symptoms. There are no medications, such as antibiotics, that will cure your infection.   Home care instructions:  You can take Tylenol  and/or Ibuprofen  as directed on the packaging for fever reduction and pain relief.    For cough: honey 1/2 to 1 teaspoon (you can dilute the honey in water or another fluid).  You can also use guaifenesin and dextromethorphan for cough. You can use a humidifier for chest congestion and cough.  If you don't have a humidifier, you can sit in the bathroom with the hot shower running.      For sore throat: try warm salt water gargles, cepacol lozenges, throat spray, warm tea or water with lemon/honey, popsicles or ice, or OTC cold relief medicine for throat discomfort.    For congestion: take a daily anti-histamine like Zyrtec, Claritin, and a oral decongestant, such as pseudoephedrine.  You can also use Flonase 1-2 sprays in each nostril daily.    It is important to stay hydrated: drink plenty of fluids (water, gatorade/powerade/pedialyte, juices, or teas) to keep your throat moisturized and help further relieve irritation/discomfort.   Your illness is contagious and can be spread to others, especially during the first 3 or 4 days. It cannot be cured by antibiotics or other medicines. Take basic precautions such as washing your hands often, covering your mouth  when you cough or sneeze, and avoiding public places where you could spread your illness to others.   Please continue drinking plenty of fluids.  Use over-the-counter medicines as needed as directed on packaging for symptom relief.  You may also use ibuprofen  or tylenol  as directed on packaging for pain or fever.  Do not take multiple medicines containing Tylenol  or acetaminophen  to avoid taking too much of this medication.  Follow-up instructions: Please follow-up with your primary care provider in the next 3 days for further evaluation of your symptoms if you are not feeling better.   Return instructions:  Please return to the Emergency Department if you experience worsening symptoms.  RETURN IMMEDIATELY IF you develop shortness of breath, confusion or altered mental status, a new rash, become dizzy, faint, or poorly responsive, or are unable to be cared for at home. Please return if you have persistent vomiting and cannot keep down fluids or develop a fever that is not controlled by tylenol  or motrin .   Please return if you have any other emergent concerns.  Additional Information:  Your vital signs today were: BP 137/87   Pulse 71   Temp 98.1 F (36.7 C) (Oral)   Resp 17   Ht 5' 5 (1.651 m)   Wt 87.1 kg   LMP 11/10/2013   SpO2 100%   BMI 31.95 kg/m  If your blood pressure (BP) was elevated above 135/85 this visit, please have this repeated by your doctor within one month. --------------
